# Patient Record
Sex: Female | Born: 1945
Health system: Southern US, Community
[De-identification: ages and names within clinical notes are randomized; demographics above are authoritative.]

## PROBLEM LIST (undated history)

## (undated) DIAGNOSIS — C801 Malignant (primary) neoplasm, unspecified: Secondary | ICD-10-CM

## (undated) DIAGNOSIS — M1990 Unspecified osteoarthritis, unspecified site: Secondary | ICD-10-CM

## (undated) DIAGNOSIS — R51 Headache: Secondary | ICD-10-CM

## (undated) DIAGNOSIS — R011 Cardiac murmur, unspecified: Secondary | ICD-10-CM

## (undated) DIAGNOSIS — C50919 Malignant neoplasm of unspecified site of unspecified female breast: Secondary | ICD-10-CM

## (undated) DIAGNOSIS — K219 Gastro-esophageal reflux disease without esophagitis: Secondary | ICD-10-CM

---

## 1999-12-28 ENCOUNTER — Encounter: Payer: Self-pay | Admitting: Obstetrics and Gynecology

## 1999-12-28 ENCOUNTER — Encounter: Admission: RE | Admit: 1999-12-28 | Discharge: 1999-12-28 | Payer: Self-pay | Admitting: Obstetrics and Gynecology

## 2000-01-10 ENCOUNTER — Encounter: Admission: RE | Admit: 2000-01-10 | Discharge: 2000-01-10 | Payer: Self-pay | Admitting: Obstetrics and Gynecology

## 2000-01-10 ENCOUNTER — Encounter: Payer: Self-pay | Admitting: Obstetrics and Gynecology

## 2000-12-31 ENCOUNTER — Encounter: Payer: Self-pay | Admitting: Obstetrics and Gynecology

## 2000-12-31 ENCOUNTER — Encounter: Admission: RE | Admit: 2000-12-31 | Discharge: 2000-12-31 | Payer: Self-pay | Admitting: Obstetrics and Gynecology

## 2001-03-27 ENCOUNTER — Ambulatory Visit (HOSPITAL_COMMUNITY): Admission: RE | Admit: 2001-03-27 | Discharge: 2001-03-27 | Payer: Self-pay | Admitting: Gastroenterology

## 2001-12-30 ENCOUNTER — Other Ambulatory Visit: Admission: RE | Admit: 2001-12-30 | Discharge: 2001-12-30 | Payer: Self-pay | Admitting: Gynecology

## 2002-01-14 ENCOUNTER — Encounter: Payer: Self-pay | Admitting: Gynecology

## 2002-01-14 ENCOUNTER — Encounter: Admission: RE | Admit: 2002-01-14 | Discharge: 2002-01-14 | Payer: Self-pay | Admitting: Gynecology

## 2003-01-28 ENCOUNTER — Encounter: Admission: RE | Admit: 2003-01-28 | Discharge: 2003-01-28 | Payer: Self-pay | Admitting: Gynecology

## 2003-01-28 ENCOUNTER — Encounter: Payer: Self-pay | Admitting: Gynecology

## 2003-02-04 ENCOUNTER — Other Ambulatory Visit: Admission: RE | Admit: 2003-02-04 | Discharge: 2003-02-04 | Payer: Self-pay | Admitting: Gynecology

## 2004-02-07 ENCOUNTER — Encounter: Admission: RE | Admit: 2004-02-07 | Discharge: 2004-02-07 | Payer: Self-pay | Admitting: Gynecology

## 2004-02-14 ENCOUNTER — Other Ambulatory Visit: Admission: RE | Admit: 2004-02-14 | Discharge: 2004-02-14 | Payer: Self-pay | Admitting: Gynecology

## 2004-07-17 ENCOUNTER — Ambulatory Visit (HOSPITAL_COMMUNITY): Admission: RE | Admit: 2004-07-17 | Discharge: 2004-07-17 | Payer: Self-pay | Admitting: Obstetrics and Gynecology

## 2004-10-15 DIAGNOSIS — C801 Malignant (primary) neoplasm, unspecified: Secondary | ICD-10-CM

## 2004-10-15 HISTORY — DX: Malignant (primary) neoplasm, unspecified: C80.1

## 2004-10-15 HISTORY — PX: BREAST BIOPSY: SHX20

## 2004-10-15 HISTORY — PX: BREAST LUMPECTOMY: SHX2

## 2005-02-20 ENCOUNTER — Encounter: Admission: RE | Admit: 2005-02-20 | Discharge: 2005-02-20 | Payer: Self-pay | Admitting: Obstetrics and Gynecology

## 2005-02-22 ENCOUNTER — Encounter: Admission: RE | Admit: 2005-02-22 | Discharge: 2005-02-22 | Payer: Self-pay | Admitting: Obstetrics and Gynecology

## 2005-02-23 ENCOUNTER — Encounter (INDEPENDENT_AMBULATORY_CARE_PROVIDER_SITE_OTHER): Payer: Self-pay | Admitting: Radiology

## 2005-02-23 ENCOUNTER — Encounter (INDEPENDENT_AMBULATORY_CARE_PROVIDER_SITE_OTHER): Payer: Self-pay | Admitting: Specialist

## 2005-02-23 ENCOUNTER — Encounter: Admission: RE | Admit: 2005-02-23 | Discharge: 2005-02-23 | Payer: Self-pay | Admitting: Obstetrics and Gynecology

## 2005-03-05 ENCOUNTER — Encounter: Admission: RE | Admit: 2005-03-05 | Discharge: 2005-03-05 | Payer: Self-pay | Admitting: General Surgery

## 2005-03-06 ENCOUNTER — Other Ambulatory Visit: Admission: RE | Admit: 2005-03-06 | Discharge: 2005-03-06 | Payer: Self-pay | Admitting: Obstetrics and Gynecology

## 2005-03-13 ENCOUNTER — Encounter: Admission: RE | Admit: 2005-03-13 | Discharge: 2005-03-13 | Payer: Self-pay | Admitting: General Surgery

## 2005-03-15 ENCOUNTER — Encounter (INDEPENDENT_AMBULATORY_CARE_PROVIDER_SITE_OTHER): Payer: Self-pay | Admitting: Specialist

## 2005-03-15 ENCOUNTER — Ambulatory Visit (HOSPITAL_BASED_OUTPATIENT_CLINIC_OR_DEPARTMENT_OTHER): Admission: RE | Admit: 2005-03-15 | Discharge: 2005-03-15 | Payer: Self-pay | Admitting: General Surgery

## 2005-03-15 ENCOUNTER — Ambulatory Visit (HOSPITAL_COMMUNITY): Admission: RE | Admit: 2005-03-15 | Discharge: 2005-03-15 | Payer: Self-pay | Admitting: General Surgery

## 2005-03-15 ENCOUNTER — Encounter: Admission: RE | Admit: 2005-03-15 | Discharge: 2005-03-15 | Payer: Self-pay | Admitting: General Surgery

## 2005-03-16 ENCOUNTER — Ambulatory Visit: Payer: Self-pay | Admitting: Oncology

## 2005-03-27 ENCOUNTER — Ambulatory Visit: Admission: RE | Admit: 2005-03-27 | Discharge: 2005-06-07 | Payer: Self-pay | Admitting: Radiation Oncology

## 2005-06-01 ENCOUNTER — Ambulatory Visit: Payer: Self-pay | Admitting: Oncology

## 2005-06-08 ENCOUNTER — Ambulatory Visit (HOSPITAL_COMMUNITY): Admission: RE | Admit: 2005-06-08 | Discharge: 2005-06-08 | Payer: Self-pay | Admitting: Oncology

## 2005-08-31 ENCOUNTER — Ambulatory Visit: Payer: Self-pay | Admitting: Oncology

## 2005-12-07 ENCOUNTER — Ambulatory Visit: Payer: Self-pay | Admitting: Oncology

## 2005-12-10 ENCOUNTER — Ambulatory Visit (HOSPITAL_COMMUNITY): Admission: RE | Admit: 2005-12-10 | Discharge: 2005-12-10 | Payer: Self-pay | Admitting: Oncology

## 2006-02-25 ENCOUNTER — Encounter: Admission: RE | Admit: 2006-02-25 | Discharge: 2006-02-25 | Payer: Self-pay | Admitting: Internal Medicine

## 2006-03-13 ENCOUNTER — Other Ambulatory Visit: Admission: RE | Admit: 2006-03-13 | Discharge: 2006-03-13 | Payer: Self-pay | Admitting: Obstetrics and Gynecology

## 2006-05-30 ENCOUNTER — Ambulatory Visit: Payer: Self-pay | Admitting: Oncology

## 2006-06-05 LAB — COMPREHENSIVE METABOLIC PANEL
ALT: 14 U/L (ref 0–40)
CO2: 30 mEq/L (ref 19–32)
Calcium: 9.4 mg/dL (ref 8.4–10.5)
Chloride: 105 mEq/L (ref 96–112)
Creatinine, Ser: 0.9 mg/dL (ref 0.40–1.20)
Glucose, Bld: 78 mg/dL (ref 70–99)
Sodium: 144 mEq/L (ref 135–145)
Total Bilirubin: 0.5 mg/dL (ref 0.3–1.2)
Total Protein: 6.7 g/dL (ref 6.0–8.3)

## 2006-06-05 LAB — CBC WITH DIFFERENTIAL/PLATELET
BASO%: 0.5 % (ref 0.0–2.0)
Eosinophils Absolute: 0.2 10*3/uL (ref 0.0–0.5)
HCT: 37.8 % (ref 34.8–46.6)
HGB: 13.2 g/dL (ref 11.6–15.9)
LYMPH%: 20.5 % (ref 14.0–48.0)
MCHC: 34.9 g/dL (ref 32.0–36.0)
MONO#: 0.4 10*3/uL (ref 0.1–0.9)
NEUT#: 3.3 10*3/uL (ref 1.5–6.5)
NEUT%: 66.9 % (ref 39.6–76.8)
Platelets: 221 10*3/uL (ref 145–400)
WBC: 4.9 10*3/uL (ref 3.9–10.0)
lymph#: 1 10*3/uL (ref 0.9–3.3)

## 2006-06-05 LAB — CANCER ANTIGEN 27.29: CA 27.29: 41 U/mL — ABNORMAL HIGH (ref 0–39)

## 2006-11-25 ENCOUNTER — Ambulatory Visit: Payer: Self-pay | Admitting: Oncology

## 2006-11-26 LAB — COMPREHENSIVE METABOLIC PANEL
BUN: 25 mg/dL — ABNORMAL HIGH (ref 6–23)
CO2: 28 mEq/L (ref 19–32)
Calcium: 9.1 mg/dL (ref 8.4–10.5)
Chloride: 101 mEq/L (ref 96–112)
Creatinine, Ser: 0.95 mg/dL (ref 0.40–1.20)
Glucose, Bld: 87 mg/dL (ref 70–99)
Total Bilirubin: 0.3 mg/dL (ref 0.3–1.2)

## 2006-11-26 LAB — CBC WITH DIFFERENTIAL/PLATELET
BASO%: 0.3 % (ref 0.0–2.0)
EOS%: 3.4 % (ref 0.0–7.0)
MCH: 33.1 pg (ref 26.0–34.0)
MCHC: 36.5 g/dL — ABNORMAL HIGH (ref 32.0–36.0)
MONO#: 0.4 10*3/uL (ref 0.1–0.9)
RDW: 12.3 % (ref 11.3–14.5)
WBC: 5.8 10*3/uL (ref 3.9–10.0)
lymph#: 1.2 10*3/uL (ref 0.9–3.3)

## 2007-02-27 ENCOUNTER — Encounter: Admission: RE | Admit: 2007-02-27 | Discharge: 2007-02-27 | Payer: Self-pay | Admitting: Oncology

## 2007-03-17 ENCOUNTER — Other Ambulatory Visit: Admission: RE | Admit: 2007-03-17 | Discharge: 2007-03-17 | Payer: Self-pay | Admitting: Obstetrics and Gynecology

## 2007-05-26 ENCOUNTER — Ambulatory Visit: Payer: Self-pay | Admitting: Oncology

## 2007-06-02 LAB — CANCER ANTIGEN 27.29: CA 27.29: 36 U/mL (ref 0–39)

## 2007-06-02 LAB — COMPREHENSIVE METABOLIC PANEL
ALT: 11 U/L (ref 0–35)
CO2: 27 mEq/L (ref 19–32)
Calcium: 9.1 mg/dL (ref 8.4–10.5)
Chloride: 104 mEq/L (ref 96–112)
Creatinine, Ser: 0.83 mg/dL (ref 0.40–1.20)
Glucose, Bld: 90 mg/dL (ref 70–99)
Sodium: 141 mEq/L (ref 135–145)
Total Bilirubin: 0.4 mg/dL (ref 0.3–1.2)
Total Protein: 6.5 g/dL (ref 6.0–8.3)

## 2007-06-02 LAB — CBC WITH DIFFERENTIAL/PLATELET
BASO%: 0.4 % (ref 0.0–2.0)
Eosinophils Absolute: 0.2 10*3/uL (ref 0.0–0.5)
HCT: 40.5 % (ref 34.8–46.6)
LYMPH%: 23.5 % (ref 14.0–48.0)
MCHC: 35.3 g/dL (ref 32.0–36.0)
MONO#: 0.3 10*3/uL (ref 0.1–0.9)
NEUT#: 2.7 10*3/uL (ref 1.5–6.5)
NEUT%: 63.6 % (ref 39.6–76.8)
Platelets: 183 10*3/uL (ref 145–400)
WBC: 4.3 10*3/uL (ref 3.9–10.0)
lymph#: 1 10*3/uL (ref 0.9–3.3)

## 2007-06-09 LAB — VITAMIN D PNL(25-HYDRXY+1,25-DIHY)-BLD: Vit D, 1,25-Dihydroxy: 35 pg/mL (ref 6–62)

## 2008-03-02 ENCOUNTER — Encounter: Admission: RE | Admit: 2008-03-02 | Discharge: 2008-03-02 | Payer: Self-pay | Admitting: Internal Medicine

## 2008-04-13 ENCOUNTER — Other Ambulatory Visit: Admission: RE | Admit: 2008-04-13 | Discharge: 2008-04-13 | Payer: Self-pay | Admitting: Obstetrics and Gynecology

## 2008-04-15 ENCOUNTER — Ambulatory Visit (HOSPITAL_COMMUNITY): Admission: RE | Admit: 2008-04-15 | Discharge: 2008-04-15 | Payer: Self-pay | Admitting: Obstetrics and Gynecology

## 2008-05-25 ENCOUNTER — Ambulatory Visit: Payer: Self-pay | Admitting: Oncology

## 2008-05-27 LAB — CBC WITH DIFFERENTIAL/PLATELET
EOS%: 0.1 % (ref 0.0–7.0)
Eosinophils Absolute: 0 10*3/uL (ref 0.0–0.5)
LYMPH%: 11.7 % — ABNORMAL LOW (ref 14.0–48.0)
MCH: 34.1 pg — ABNORMAL HIGH (ref 26.0–34.0)
MCHC: 35.1 g/dL (ref 32.0–36.0)
MCV: 97.4 fL (ref 81.0–101.0)
MONO%: 1.1 % (ref 0.0–13.0)
NEUT#: 4.1 10*3/uL (ref 1.5–6.5)
Platelets: 173 10*3/uL (ref 145–400)
RBC: 4.13 10*6/uL (ref 3.70–5.32)
RDW: 13 % (ref 11.3–14.5)

## 2008-05-28 LAB — VITAMIN D 25 HYDROXY (VIT D DEFICIENCY, FRACTURES): Vit D, 25-Hydroxy: 36 ng/mL (ref 30–89)

## 2008-05-28 LAB — COMPREHENSIVE METABOLIC PANEL
AST: 26 U/L (ref 0–37)
Albumin: 4.6 g/dL (ref 3.5–5.2)
Alkaline Phosphatase: 73 U/L (ref 39–117)
Glucose, Bld: 137 mg/dL — ABNORMAL HIGH (ref 70–99)
Potassium: 4.1 mEq/L (ref 3.5–5.3)
Sodium: 140 mEq/L (ref 135–145)
Total Bilirubin: 0.4 mg/dL (ref 0.3–1.2)
Total Protein: 7.5 g/dL (ref 6.0–8.3)

## 2008-05-28 LAB — FOLLICLE STIMULATING HORMONE: FSH: 69.4 m[IU]/mL

## 2008-06-05 LAB — ESTRADIOL, ULTRA SENS

## 2008-07-12 ENCOUNTER — Ambulatory Visit: Payer: Self-pay | Admitting: Pulmonary Disease

## 2008-07-12 DIAGNOSIS — G4700 Insomnia, unspecified: Secondary | ICD-10-CM | POA: Insufficient documentation

## 2008-07-12 DIAGNOSIS — R519 Headache, unspecified: Secondary | ICD-10-CM | POA: Insufficient documentation

## 2008-07-12 DIAGNOSIS — R51 Headache: Secondary | ICD-10-CM

## 2008-07-12 DIAGNOSIS — Z853 Personal history of malignant neoplasm of breast: Secondary | ICD-10-CM

## 2008-07-20 ENCOUNTER — Telehealth (INDEPENDENT_AMBULATORY_CARE_PROVIDER_SITE_OTHER): Payer: Self-pay | Admitting: *Deleted

## 2008-08-02 ENCOUNTER — Ambulatory Visit: Payer: Self-pay | Admitting: Pulmonary Disease

## 2009-03-21 ENCOUNTER — Encounter: Admission: RE | Admit: 2009-03-21 | Discharge: 2009-03-21 | Payer: Self-pay | Admitting: Oncology

## 2009-05-25 ENCOUNTER — Other Ambulatory Visit: Admission: RE | Admit: 2009-05-25 | Discharge: 2009-05-25 | Payer: Self-pay | Admitting: Obstetrics and Gynecology

## 2009-05-26 ENCOUNTER — Ambulatory Visit: Payer: Self-pay | Admitting: Oncology

## 2009-05-30 LAB — CBC WITH DIFFERENTIAL/PLATELET
Basophils Absolute: 0 10*3/uL (ref 0.0–0.1)
EOS%: 3.7 % (ref 0.0–7.0)
Eosinophils Absolute: 0.2 10*3/uL (ref 0.0–0.5)
HCT: 39.9 % (ref 34.8–46.6)
HGB: 13.8 g/dL (ref 11.6–15.9)
LYMPH%: 25.1 % (ref 14.0–49.7)
MCH: 33.1 pg (ref 25.1–34.0)
MCV: 95.7 fL (ref 79.5–101.0)
MONO%: 8 % (ref 0.0–14.0)
NEUT#: 3.1 10*3/uL (ref 1.5–6.5)
NEUT%: 62.8 % (ref 38.4–76.8)
Platelets: 182 10*3/uL (ref 145–400)

## 2009-05-30 LAB — COMPREHENSIVE METABOLIC PANEL
AST: 25 U/L (ref 0–37)
Albumin: 4.3 g/dL (ref 3.5–5.2)
Alkaline Phosphatase: 101 U/L (ref 39–117)
BUN: 25 mg/dL — ABNORMAL HIGH (ref 6–23)
Creatinine, Ser: 0.91 mg/dL (ref 0.40–1.20)
Glucose, Bld: 99 mg/dL (ref 70–99)
Potassium: 4 mEq/L (ref 3.5–5.3)

## 2009-06-10 ENCOUNTER — Encounter: Admission: RE | Admit: 2009-06-10 | Discharge: 2009-06-10 | Payer: Self-pay | Admitting: Oncology

## 2010-03-29 ENCOUNTER — Encounter: Admission: RE | Admit: 2010-03-29 | Discharge: 2010-03-29 | Payer: Self-pay | Admitting: Internal Medicine

## 2010-05-26 ENCOUNTER — Other Ambulatory Visit: Admission: RE | Admit: 2010-05-26 | Discharge: 2010-05-26 | Payer: Self-pay | Admitting: Obstetrics and Gynecology

## 2011-03-02 NOTE — Op Note (Signed)
Tonya Benson, Tonya Benson                ACCOUNT NO.:  [id number]   MEDICAL RECORD NO.:  [id number]          PATIENT TYPE:  AMB   LOCATION:  DSC                          FACILITY:  [facility]   PHYSICIAN:  Rose Phi. Maple Hudson, M.D.   DATE OF BIRTH:  Mar 11, 1946   DATE OF PROCEDURE:  03/15/2005  DATE OF DISCHARGE:                                 OPERATIVE REPORT   PREOPERATIVE DIAGNOSIS:  Stage I carcinoma of the right breast.   POSTOPERATIVE DIAGNOSIS:  Stage I carcinoma of the right breast.   OPERATION:  1.  Blue dye injection.  2.  Right partial mastectomy with needle localization and specimen      mammogram.  3.  Right sentinel lymph node biopsy.   SURGEON:  Rose Phi. Maple Hudson, M.D.   ANESTHESIA:  General.   OPERATIVE PROCEDURE:  Prior to going to the operating room a localizing wire  had been placed in the known lesion of the 12 o'clock position of the right  breast and 1 mCi of technetium sulfur colloid was injected intradermally.   After suitable general anesthesia was induced, the patient was placed in  supine position with the arms extended on an arm board. 5 cc of a mixture of  2 cc of methylene blue and 3 cc of injectable saline were injected in the  subareolar breast tissue and the breast gently massaged for about three  minutes.   After prepping and draping, a curved incision centered at the 12 o'clock  position using the previously placed wire in the right breast was then made  and the wire exposed in the incision, then a wide excision of the wire and  surrounding tissue was carried out. Specimen was oriented for the  pathologist and submitted then for specimen mammogram and then to the  pathologist for margin evaluation.   While that was being done, a short transverse right axillary incision was  made with dissection through subcutaneous tissue to the clavipectoral  fascia. Deep to the fascia were blue lymphatics which traced into a large  blue and warm lymph node. That was  excised as a sentinel lymph node and  there were no other palpable blue or hot nodes.   The pathologic interpretation of the lymph node turned out to be a lot of  fatty infiltration but no tumor cells. The specimen mammogram confirmed the  removal of the lesion and the margins turned out to be clean as reported by  the pathologist.   Both incisions had good hemostasis. Both of them were infiltrated with 0.25%  Marcaine. We then closed layers with 3-0 Vicryl and subcuticular 4-0  Monocryl and Steri-Strips.  Dressings applied. The patient transferred to  the recovery room in satisfactory condition having tolerated procedure well.      PRY/MEDQ  D:  03/15/2005  T:  03/15/2005  Job:  [id number]

## 2011-03-02 NOTE — Op Note (Signed)
[place]  Patient:    Tonya Benson, Tonya Benson                         MRN: [id number] Proc. Date: 03/27/01 Attending:  Verlin Grills, M.D. CC:         Katherine Roan, M.D.  Pearla Dubonnet, M.D.   Operative Report  PROCEDURE:  Diagnostic colonoscopy.  REFERRING PHYSICIAN:  Katherine Roan, M.D. and Pearla Dubonnet, M.D.  INDICATION:  [name] (date of birth 05-Jun-1946) is a 65 year old female.  In February 2002, she underwent her health maintenance physical exam and gynecologic exam.  She submitted stools for hemoccult testing.  Two of six stools were hemoccult positive.  ENDOSCOPIST:  Verlin Grills, M.D.  PREMEDICATION:  Versed 10 mg, fentanyl 50 mcg.  ENDOSCOPE:  Olympus pediatric colonoscopy.  DESCRIPTION OF PROCEDURE:  After obtaining informed consent, [name] was placed in the left lateral decubitus position.  I administered intravenous fentanyl and intravenous Versed to achieve conscious sedation for the procedure.  The patients blood pressure, oxygen saturation, and cardiac rhythm were monitored throughout the procedure and documented in the medical record.  Anal inspection was normal.  Digital rectal exam was normal.  The Olympus pediatric colonoscope was introduced into the rectum and easily advanced to the cecum.  Colonic preparation for the exam today was excellent.  Rectum:  Normal.  Sigmoid Colon and Descending Colon: Normal.  Splenic Flexure: Normal.  Transverse Colon: Normal.  Hepatic Flexure: Normal.  Ascending Colon: Normal.  Cecum and Ileocecal Valve: Normal.  ASSESSMENT:  Normal proctocolonoscopy to the cecum.  No endoscopic evidence for the presence of colorectal neoplasia.  RECOMMENDATIONS:  Repeat colonoscopy in approximately 10 years. DD:  03/27/01 TD:  03/27/01 Job: [id number]

## 2011-03-19 ENCOUNTER — Other Ambulatory Visit: Payer: Self-pay | Admitting: Obstetrics and Gynecology

## 2011-03-19 DIAGNOSIS — Z853 Personal history of malignant neoplasm of breast: Secondary | ICD-10-CM

## 2011-04-16 ENCOUNTER — Ambulatory Visit
Admission: RE | Admit: 2011-04-16 | Discharge: 2011-04-16 | Disposition: A | Discharge status: Home / Self Care | Payer: PRIVATE HEALTH INSURANCE | Source: Ambulatory Visit | Attending: Obstetrics and Gynecology | Admitting: Obstetrics and Gynecology

## 2011-04-16 DIAGNOSIS — Z853 Personal history of malignant neoplasm of breast: Secondary | ICD-10-CM

## 2011-05-31 ENCOUNTER — Other Ambulatory Visit (HOSPITAL_COMMUNITY)
Admission: RE | Admit: 2011-05-31 | Discharge: 2011-05-31 | Disposition: A | Discharge status: Home / Self Care | Payer: PRIVATE HEALTH INSURANCE | Source: Ambulatory Visit | Attending: Obstetrics and Gynecology | Admitting: Obstetrics and Gynecology

## 2011-05-31 ENCOUNTER — Other Ambulatory Visit: Payer: Self-pay | Admitting: Obstetrics and Gynecology

## 2011-05-31 DIAGNOSIS — Z01419 Encounter for gynecological examination (general) (routine) without abnormal findings: Secondary | ICD-10-CM | POA: Insufficient documentation

## 2012-03-18 ENCOUNTER — Other Ambulatory Visit: Payer: Self-pay | Admitting: Obstetrics and Gynecology

## 2012-03-18 DIAGNOSIS — Z853 Personal history of malignant neoplasm of breast: Secondary | ICD-10-CM

## 2012-05-08 ENCOUNTER — Ambulatory Visit
Admission: RE | Admit: 2012-05-08 | Discharge: 2012-05-08 | Disposition: A | Discharge status: Home / Self Care | Payer: PRIVATE HEALTH INSURANCE | Source: Ambulatory Visit | Attending: Obstetrics and Gynecology | Admitting: Obstetrics and Gynecology

## 2012-05-08 DIAGNOSIS — Z853 Personal history of malignant neoplasm of breast: Secondary | ICD-10-CM

## 2012-06-03 ENCOUNTER — Other Ambulatory Visit: Payer: Self-pay | Admitting: Obstetrics and Gynecology

## 2013-05-11 ENCOUNTER — Other Ambulatory Visit: Payer: Self-pay

## 2013-05-11 DIAGNOSIS — Z1231 Encounter for screening mammogram for malignant neoplasm of breast: Secondary | ICD-10-CM

## 2013-05-11 DIAGNOSIS — Z853 Personal history of malignant neoplasm of breast: Secondary | ICD-10-CM

## 2013-05-27 ENCOUNTER — Ambulatory Visit
Admission: RE | Admit: 2013-05-27 | Discharge: 2013-05-27 | Disposition: A | Discharge status: Home / Self Care | Payer: PRIVATE HEALTH INSURANCE | Source: Ambulatory Visit

## 2013-05-27 DIAGNOSIS — Z1231 Encounter for screening mammogram for malignant neoplasm of breast: Secondary | ICD-10-CM

## 2013-05-27 DIAGNOSIS — Z853 Personal history of malignant neoplasm of breast: Secondary | ICD-10-CM

## 2013-06-04 ENCOUNTER — Other Ambulatory Visit (HOSPITAL_COMMUNITY)
Admission: RE | Admit: 2013-06-04 | Discharge: 2013-06-04 | Disposition: A | Discharge status: Home / Self Care | Payer: Medicare Other | Source: Ambulatory Visit | Attending: Obstetrics and Gynecology | Admitting: Obstetrics and Gynecology

## 2013-06-04 ENCOUNTER — Other Ambulatory Visit: Payer: Self-pay | Admitting: Obstetrics and Gynecology

## 2013-06-04 DIAGNOSIS — Z01419 Encounter for gynecological examination (general) (routine) without abnormal findings: Secondary | ICD-10-CM | POA: Insufficient documentation

## 2013-06-04 DIAGNOSIS — Z1151 Encounter for screening for human papillomavirus (HPV): Secondary | ICD-10-CM | POA: Insufficient documentation

## 2013-06-04 DIAGNOSIS — N952 Postmenopausal atrophic vaginitis: Secondary | ICD-10-CM | POA: Diagnosis not present

## 2013-06-26 DIAGNOSIS — G4700 Insomnia, unspecified: Secondary | ICD-10-CM | POA: Diagnosis not present

## 2013-06-26 DIAGNOSIS — Z79899 Other long term (current) drug therapy: Secondary | ICD-10-CM | POA: Diagnosis not present

## 2013-06-26 DIAGNOSIS — Z1331 Encounter for screening for depression: Secondary | ICD-10-CM | POA: Diagnosis not present

## 2013-06-26 DIAGNOSIS — E669 Obesity, unspecified: Secondary | ICD-10-CM | POA: Diagnosis not present

## 2013-06-26 DIAGNOSIS — K219 Gastro-esophageal reflux disease without esophagitis: Secondary | ICD-10-CM | POA: Diagnosis not present

## 2013-06-26 DIAGNOSIS — B009 Herpesviral infection, unspecified: Secondary | ICD-10-CM | POA: Diagnosis not present

## 2013-06-26 DIAGNOSIS — M169 Osteoarthritis of hip, unspecified: Secondary | ICD-10-CM | POA: Diagnosis not present

## 2013-06-26 DIAGNOSIS — Z0000 Encounter for general adult medical examination without abnormal findings: Secondary | ICD-10-CM | POA: Diagnosis not present

## 2013-06-26 DIAGNOSIS — E559 Vitamin D deficiency, unspecified: Secondary | ICD-10-CM | POA: Diagnosis not present

## 2013-07-13 DIAGNOSIS — M25539 Pain in unspecified wrist: Secondary | ICD-10-CM | POA: Diagnosis not present

## 2013-07-13 DIAGNOSIS — M79609 Pain in unspecified limb: Secondary | ICD-10-CM | POA: Diagnosis not present

## 2013-07-13 DIAGNOSIS — M25519 Pain in unspecified shoulder: Secondary | ICD-10-CM | POA: Diagnosis not present

## 2013-07-15 DIAGNOSIS — M25549 Pain in joints of unspecified hand: Secondary | ICD-10-CM | POA: Diagnosis not present

## 2013-12-09 DIAGNOSIS — M169 Osteoarthritis of hip, unspecified: Secondary | ICD-10-CM | POA: Diagnosis not present

## 2013-12-09 DIAGNOSIS — M1610 Unilateral primary osteoarthritis, unspecified hip: Secondary | ICD-10-CM | POA: Diagnosis not present

## 2013-12-24 DIAGNOSIS — M1610 Unilateral primary osteoarthritis, unspecified hip: Secondary | ICD-10-CM | POA: Diagnosis not present

## 2013-12-24 DIAGNOSIS — M169 Osteoarthritis of hip, unspecified: Secondary | ICD-10-CM | POA: Diagnosis not present

## 2013-12-25 DIAGNOSIS — Z79899 Other long term (current) drug therapy: Secondary | ICD-10-CM | POA: Diagnosis not present

## 2013-12-25 DIAGNOSIS — M1610 Unilateral primary osteoarthritis, unspecified hip: Secondary | ICD-10-CM | POA: Diagnosis not present

## 2013-12-25 DIAGNOSIS — M169 Osteoarthritis of hip, unspecified: Secondary | ICD-10-CM | POA: Diagnosis not present

## 2014-01-20 ENCOUNTER — Encounter (HOSPITAL_COMMUNITY): Payer: Self-pay | Admitting: Pharmacy Technician

## 2014-01-25 ENCOUNTER — Encounter (HOSPITAL_COMMUNITY): Payer: Self-pay

## 2014-01-25 ENCOUNTER — Encounter (HOSPITAL_COMMUNITY)
Admission: RE | Admit: 2014-01-25 | Discharge: 2014-01-25 | Disposition: A | Discharge status: Home / Self Care | Payer: Medicare Other | Source: Ambulatory Visit | Attending: Orthopedic Surgery | Admitting: Orthopedic Surgery

## 2014-01-25 ENCOUNTER — Encounter (INDEPENDENT_AMBULATORY_CARE_PROVIDER_SITE_OTHER): Payer: Self-pay

## 2014-01-25 DIAGNOSIS — M169 Osteoarthritis of hip, unspecified: Secondary | ICD-10-CM | POA: Diagnosis not present

## 2014-01-25 DIAGNOSIS — Z0183 Encounter for blood typing: Secondary | ICD-10-CM | POA: Diagnosis not present

## 2014-01-25 DIAGNOSIS — M1610 Unilateral primary osteoarthritis, unspecified hip: Secondary | ICD-10-CM | POA: Diagnosis not present

## 2014-01-25 DIAGNOSIS — Z01812 Encounter for preprocedural laboratory examination: Secondary | ICD-10-CM | POA: Insufficient documentation

## 2014-01-25 HISTORY — DX: Cardiac murmur, unspecified: R01.1

## 2014-01-25 HISTORY — DX: Headache: R51

## 2014-01-25 HISTORY — DX: Unspecified osteoarthritis, unspecified site: M19.90

## 2014-01-25 HISTORY — DX: Malignant (primary) neoplasm, unspecified: C80.1

## 2014-01-25 HISTORY — DX: Gastro-esophageal reflux disease without esophagitis: K21.9

## 2014-01-25 LAB — URINE MICROSCOPIC-ADD ON

## 2014-01-25 LAB — URINALYSIS, ROUTINE W REFLEX MICROSCOPIC
Bilirubin Urine: NEGATIVE
Glucose, UA: NEGATIVE mg/dL
Hgb urine dipstick: NEGATIVE
Ketones, ur: NEGATIVE mg/dL
Nitrite: NEGATIVE
PROTEIN: NEGATIVE mg/dL
Specific Gravity, Urine: 1.022 (ref 1.005–1.030)
UROBILINOGEN UA: 0.2 mg/dL (ref 0.0–1.0)
pH: 5 (ref 5.0–8.0)

## 2014-01-25 LAB — BASIC METABOLIC PANEL
BUN: 19 mg/dL (ref 6–23)
CALCIUM: 9.6 mg/dL (ref 8.4–10.5)
CO2: 25 mEq/L (ref 19–32)
CREATININE: 0.82 mg/dL (ref 0.50–1.10)
Chloride: 101 mEq/L (ref 96–112)
GFR, EST AFRICAN AMERICAN: 85 mL/min — AB (ref >90)
GFR, EST NON AFRICAN AMERICAN: 73 mL/min — AB (ref >90)
Glucose, Bld: 87 mg/dL (ref 70–99)
Potassium: 4.1 mEq/L (ref 3.7–5.3)
SODIUM: 139 meq/L (ref 137–147)

## 2014-01-25 LAB — CBC
HCT: 39.6 % (ref 36.0–46.0)
Hemoglobin: 13.5 g/dL (ref 12.0–15.0)
MCH: 31.9 pg (ref 26.0–34.0)
MCHC: 34.1 g/dL (ref 30.0–36.0)
MCV: 93.6 fL (ref 78.0–100.0)
PLATELETS: 201 10*3/uL (ref 150–400)
RBC: 4.23 MIL/uL (ref 3.87–5.11)
RDW: 12.4 % (ref 11.5–15.5)
WBC: 6.2 10*3/uL (ref 4.0–10.5)

## 2014-01-25 LAB — SURGICAL PCR SCREEN
MRSA, PCR: NEGATIVE
STAPHYLOCOCCUS AUREUS: POSITIVE — AB

## 2014-01-25 LAB — APTT: aPTT: 31 seconds (ref 24–37)

## 2014-01-25 LAB — PROTIME-INR
INR: 1 (ref 0.00–1.49)
PROTHROMBIN TIME: 13 s (ref 11.6–15.2)

## 2014-01-25 LAB — ABO/RH: ABO/RH(D): O NEG

## 2014-01-25 NOTE — Progress Notes (Signed)
Surgery clearance note Dr. Inda Merlin on chart

## 2014-01-25 NOTE — Patient Instructions (Addendum)
[place]  01/25/2014   Your procedure is scheduled on: 02/02/14  Report to [facility] at 05:15 AM.  Call this number if you have problems the morning of surgery 336-: [phone]   Remember: Please do not take any vitamins or herbal medications before surgery   Do not eat food or drink liquids After Midnight.     Take these medicines the morning of surgery with A SIP OF WATER: nexium   Do not wear jewelry, make-up or nail polish.  Do not wear lotions, powders, or perfumes. You may wear deodorant.  Do not shave 48 hours prior to surgery. Men may shave face and neck.  Do not bring valuables to the hospital.  Contacts, dentures or bridgework may not be worn into surgery.  Leave suitcase in the car. After surgery it may be brought to your room.  For patients admitted to the hospital, checkout time is 11:00 AM the day of discharge.   Paulette Blanch, RN  pre op nurse call if needed [phone]    [facility] - Preparing for Surgery Before surgery, you can play an important role.  Because skin is not sterile, your skin needs to be as free of germs as possible.  You can reduce the number of germs on your skin by washing with CHG (chlorahexidine gluconate) soap before surgery.  CHG is an antiseptic cleaner which kills germs and bonds with the skin to continue killing germs even after washing. Please DO NOT use if you have an allergy to CHG or antibacterial soaps.  If your skin becomes reddened/irritated stop using the CHG and inform your nurse when you arrive at Short Stay. Do not shave (including legs and underarms) for at least 48 hours prior to the first CHG shower.  You may shave your face. Please follow these instructions carefully:  1.  Shower with CHG Soap the night before surgery and the  morning of Surgery.  2.  If you choose to wash your hair, wash your hair first as usual with your  normal  shampoo.  3.  After you shampoo, rinse your hair and body thoroughly to  remove the  shampoo.                           4.  Use CHG as you would any other liquid soap.  You can apply chg directly  to the skin and wash                       Gently with a scrungie or clean washcloth.  5.  Apply the CHG Soap to your body ONLY FROM THE NECK DOWN.   Do not use on open                           Wound or open sores. Avoid contact with eyes, ears mouth and genitals (private parts).                        Genitals (private parts) with your normal soap.             6.  Wash thoroughly, paying special attention to the area where your surgery  will be performed.  7.  Thoroughly rinse your body with warm water from the neck down.  8.  DO NOT shower/wash with your normal soap after  using and rinsing off  the CHG Soap.             9.  Pat yourself dry with a clean towel.            10.  Wear clean pajamas.            11.  Place clean sheets on your bed the night of your first shower and do not  sleep with pets. Day of Surgery : Do not apply any lotions/deodorants the morning of surgery.  Please wear clean clothes to the hospital/surgery center.  FAILURE TO FOLLOW THESE INSTRUCTIONS MAY RESULT IN THE CANCELLATION OF YOUR SURGERY PATIENT SIGNATURE_________________________________  NURSE SIGNATURE__________________________________    Incentive Spirometer  An incentive spirometer is a tool that can help keep your lungs clear and active. This tool measures how well you are filling your lungs with each breath. Taking long deep breaths may help reverse or decrease the chance of developing breathing (pulmonary) problems (especially infection) following:  A long period of time when you are unable to move or be active. BEFORE THE PROCEDURE   If the spirometer includes an indicator to show your best effort, your nurse or respiratory therapist will set it to a desired goal.  If possible, sit up straight or lean slightly forward. Try not to slouch.  Hold the incentive spirometer in  an upright position. INSTRUCTIONS FOR USE  1. Sit on the edge of your bed if possible, or sit up as far as you can in bed or on a chair. 2. Hold the incentive spirometer in an upright position. 3. Breathe out normally. 4. Place the mouthpiece in your mouth and seal your lips tightly around it. 5. Breathe in slowly and as deeply as possible, raising the piston or the ball toward the top of the column. 6. Hold your breath for 3-5 seconds or for as long as possible. Allow the piston or ball to fall to the bottom of the column. 7. Remove the mouthpiece from your mouth and breathe out normally. 8. Rest for a few seconds and repeat Steps 1 through 7 at least 10 times every 1-2 hours when you are awake. Take your time and take a few normal breaths between deep breaths. 9. The spirometer may include an indicator to show your best effort. Use the indicator as a goal to work toward during each repetition. 10. After each set of 10 deep breaths, practice coughing to be sure your lungs are clear. If you have an incision (the cut made at the time of surgery), support your incision when coughing by placing a pillow or rolled up towels firmly against it. Once you are able to get out of bed, walk around indoors and cough well. You may stop using the incentive spirometer when instructed by your caregiver.  RISKS AND COMPLICATIONS  Take your time so you do not get dizzy or light-headed.  If you are in pain, you may need to take or ask for pain medication before doing incentive spirometry. It is harder to take a deep breath if you are having pain. AFTER USE  Rest and breathe slowly and easily.  It can be helpful to keep track of a log of your progress. Your caregiver can provide you with a simple table to help with this. If you are using the spirometer at home, follow these instructions: [place] IF:   You are having difficultly using the spirometer.  You have trouble using the spirometer as  often  as instructed.  Your pain medication is not giving enough relief while using the spirometer.  You develop fever of 100.5 F (38.1 C) or higher. SEEK IMMEDIATE MEDICAL CARE IF:   You cough up bloody sputum that had not been present before.  You develop fever of 102 F (38.9 C) or greater.  You develop worsening pain at or near the incision site. MAKE SURE YOU:   Understand these instructions.  Will watch your condition.  Will get help right away if you are not doing well or get worse. Document Released: 02/11/2007 Document Revised: 12/24/2011 Document Reviewed: 04/14/2007 [facility] Patient Information 2014 [place], [place].   WHAT IS A BLOOD TRANSFUSION? Blood Transfusion Information  A transfusion is the replacement of blood or some of its parts. Blood is made up of multiple cells which provide different functions.  Red blood cells carry oxygen and are used for blood loss replacement.  White blood cells fight against infection.  Platelets control bleeding.  Plasma helps clot blood.  Other blood products are available for specialized needs, such as hemophilia or other clotting disorders. BEFORE THE TRANSFUSION  Who gives blood for transfusions?   Healthy volunteers who are fully evaluated to make sure their blood is safe. This is blood bank blood. Transfusion therapy is the safest it has ever been in the practice of medicine. Before blood is taken from a donor, a complete history is taken to make sure that person has no history of diseases nor engages in risky social behavior (examples are intravenous drug use or sexual activity with multiple partners). The donor's travel history is screened to minimize risk of transmitting infections, such as malaria. The donated blood is tested for signs of infectious diseases, such as HIV and hepatitis. The blood is then tested to be sure it is compatible with you in order to minimize the chance of a transfusion reaction. If you or a  relative donates blood, this is often done in anticipation of surgery and is not appropriate for emergency situations. It takes many days to process the donated blood. RISKS AND COMPLICATIONS Although transfusion therapy is very safe and saves many lives, the main dangers of transfusion include:   Getting an infectious disease.  Developing a transfusion reaction. This is an allergic reaction to something in the blood you were given. Every precaution is taken to prevent this. The decision to have a blood transfusion has been considered carefully by your caregiver before blood is given. Blood is not given unless the benefits outweigh the risks. AFTER THE TRANSFUSION  Right after receiving a blood transfusion, you will usually feel much better and more energetic. This is especially true if your red blood cells have gotten low (anemic). The transfusion raises the level of the red blood cells which carry oxygen, and this usually causes an energy increase.  The nurse administering the transfusion will monitor you carefully for complications. HOME CARE INSTRUCTIONS  No special instructions are needed after a transfusion. You may find your energy is better. Speak with your caregiver about any limitations on activity for underlying diseases you may have. SEEK MEDICAL CARE IF:   Your condition is not improving after your transfusion.  You develop redness or irritation at the intravenous (IV) site. SEEK IMMEDIATE MEDICAL CARE IF:  Any of the following symptoms occur over the next 12 hours:  Shaking chills.  You have a temperature by mouth above 102 F (38.9 C), not controlled by medicine.  Chest, back, or muscle pain.  People around you feel you are not acting correctly or are confused.  Shortness of breath or difficulty breathing.  Dizziness and fainting.  You get a rash or develop hives.  You have a decrease in urine output.  Your urine turns a dark color or changes to pink, red, or  brown. Any of the following symptoms occur over the next 10 days:  You have a temperature by mouth above 102 F (38.9 C), not controlled by medicine.  Shortness of breath.  Weakness after normal activity.  The white part of the eye turns yellow (jaundice).  You have a decrease in the amount of urine or are urinating less often.  Your urine turns a dark color or changes to pink, red, or brown. Document Released: 09/28/2000 Document Revised: 12/24/2011 Document Reviewed: 05/17/2008 [facility] Patient Information 2014 [place].

## 2014-01-26 NOTE — Progress Notes (Signed)
Surgical screen and micro ua results faxed to dr Alvan Dame by epic

## 2014-01-28 NOTE — H&P (Signed)
TOTAL HIP ADMISSION H&P  Patient is admitted for right total hip arthroplasty, anterior approach.  Subjective:  Chief Complaint: Right hip OA / pain  HPI: Tonya Benson, 68 y.o. female, has a history of pain and functional disability in the right hip(s) due to arthritis and patient has failed non-surgical conservative treatments for greater than 12 weeks to include NSAID's and/or analgesics and activity modification.  Onset of symptoms was gradual starting 2+ years ago with gradually worsening course since that time.The patient noted no past surgery on the right hip(s).  Patient currently rates pain in the right hip at 10 out of 10 with activity. Patient has night pain, worsening of pain with activity and weight bearing, trendelenberg gait, pain that interfers with activities of daily living and pain with passive range of motion. Patient has evidence of periarticular osteophytes and joint space narrowing by imaging studies. This condition presents safety issues increasing the risk of falls.  There is no current active infection.  Risks, benefits and expectations were discussed with the patient.  Risks including but not limited to the risk of anesthesia, blood clots, nerve damage, blood vessel damage, failure of the prosthesis, infection and up to and including death.  Patient understand the risks, benefits and expectations and wishes to proceed with surgery.   D/C Plans:   Home with HHPT  Post-op Meds:    No Rx given  Tranexamic Acid:   To be given  Decadron:    To be given  FYI:    ASA post-op  Norco post-op   Patient Active Problem List   Diagnosis Date Noted  . PERSISTENT DISORDER INITIATING/MAINTAINING SLEEP 07/12/2008  . HEADACHE, CHRONIC 07/12/2008  . ADENOCARCINOMA, BREAST, HX OF 07/12/2008   Past Medical History  Diagnosis Date  . Heart murmur     slight  . GERD (gastroesophageal reflux disease)     occasional  . Headache(784.0)     in the past, none recent  . Arthritis    . Cancer 2006    right breast- over 30 radiation treatments    Past Surgical History  Procedure Laterality Date  . Breast lumpectomy Right 2006  . Breast biopsy Right 2006    No prescriptions prior to admission   Allergies  Allergen Reactions  . Adhesive [Tape] Rash    History  Substance Use Topics  . Smoking status: Never Smoker   . Smokeless tobacco: Never Used  . Alcohol Use: Yes     Comment: 2 glasses a week    No family history on file.   Review of Systems  Constitutional: Negative.   Eyes: Negative.   Respiratory: Negative.   Cardiovascular: Negative.   Gastrointestinal: Positive for heartburn.  Genitourinary: Negative.   Musculoskeletal: Positive for joint pain.  Skin: Negative.   Neurological: Positive for headaches.  Endo/Heme/Allergies: Negative.   Psychiatric/Behavioral: The patient has insomnia.     Objective:  Physical Exam  Constitutional: She appears well-developed and well-nourished.  HENT:  Head: Normocephalic and atraumatic.  Mouth/Throat: Oropharynx is clear and moist.  Eyes: Pupils are equal, round, and reactive to light.  Neck: Neck supple. No JVD present. No tracheal deviation present. No thyromegaly present.  Cardiovascular: Normal rate, regular rhythm and intact distal pulses.   Murmur heard. Respiratory: Effort normal and breath sounds normal. No stridor. No respiratory distress. She has no wheezes.  GI: Soft. There is no tenderness. There is no guarding.  Musculoskeletal:       Right hip: She exhibits decreased  range of motion, decreased strength, tenderness and bony tenderness. She exhibits no swelling, no deformity and no laceration.  Lymphadenopathy:    She has no cervical adenopathy.  Neurological: She is alert.  Skin: Skin is warm and dry.  Psychiatric: She has a normal mood and affect.     Labs:  Estimated body mass index is 28.22 kg/(m^2) as calculated from the following:   Height as of 07/12/08: 5' 7.5" (1.715 m).    Weight as of 08/02/08: 83.008 kg (183 lb).   Imaging Review Plain radiographs demonstrate severe degenerative joint disease of the right hip(s). The bone quality appears to be good for age and reported activity level.  Assessment/Plan:  End stage arthritis, right hip(s)  The patient history, physical examination, clinical judgement of the provider and imaging studies are consistent with end stage degenerative joint disease of the right hip(s) and total hip arthroplasty is deemed medically necessary. The treatment options including medical management, injection therapy, arthroscopy and arthroplasty were discussed at length. The risks and benefits of total hip arthroplasty were presented and reviewed. The risks due to aseptic loosening, infection, stiffness, dislocation/subluxation,  thromboembolic complications and other imponderables were discussed.  The patient acknowledged the explanation, agreed to proceed with the plan and consent was signed. Patient is being admitted for inpatient treatment for surgery, pain control, PT, OT, prophylactic antibiotics, VTE prophylaxis, progressive ambulation and ADL's and discharge planning.The patient is planning to be discharged home with home health services.      Tonya Benson Tonya Benson   PAC  01/28/2014, 8:41 PM

## 2014-02-02 ENCOUNTER — Inpatient Hospital Stay (HOSPITAL_COMMUNITY): Payer: Medicare Other

## 2014-02-02 ENCOUNTER — Encounter (HOSPITAL_COMMUNITY): Payer: Self-pay | Admitting: *Deleted

## 2014-02-02 ENCOUNTER — Encounter (HOSPITAL_COMMUNITY): Payer: Medicare Other | Admitting: Anesthesiology

## 2014-02-02 ENCOUNTER — Inpatient Hospital Stay (HOSPITAL_COMMUNITY)
Admission: RE | Admit: 2014-02-02 | Discharge: 2014-02-03 | DRG: 470 | Disposition: A | Discharge status: Home Health | Payer: Medicare Other | Source: Ambulatory Visit | Attending: Orthopedic Surgery | Admitting: Orthopedic Surgery

## 2014-02-02 ENCOUNTER — Encounter (HOSPITAL_COMMUNITY)
Admission: RE | Disposition: A | Discharge status: Home Health | Payer: Self-pay | Source: Ambulatory Visit | Attending: Orthopedic Surgery

## 2014-02-02 ENCOUNTER — Inpatient Hospital Stay (HOSPITAL_COMMUNITY): Payer: Medicare Other | Admitting: Anesthesiology

## 2014-02-02 DIAGNOSIS — D62 Acute posthemorrhagic anemia: Secondary | ICD-10-CM | POA: Diagnosis not present

## 2014-02-02 DIAGNOSIS — D500 Iron deficiency anemia secondary to blood loss (chronic): Secondary | ICD-10-CM | POA: Diagnosis not present

## 2014-02-02 DIAGNOSIS — K219 Gastro-esophageal reflux disease without esophagitis: Secondary | ICD-10-CM | POA: Diagnosis present

## 2014-02-02 DIAGNOSIS — Z01812 Encounter for preprocedural laboratory examination: Secondary | ICD-10-CM | POA: Diagnosis not present

## 2014-02-02 DIAGNOSIS — R51 Headache: Secondary | ICD-10-CM | POA: Diagnosis present

## 2014-02-02 DIAGNOSIS — Z853 Personal history of malignant neoplasm of breast: Secondary | ICD-10-CM | POA: Diagnosis not present

## 2014-02-02 DIAGNOSIS — Z6829 Body mass index (BMI) 29.0-29.9, adult: Secondary | ICD-10-CM

## 2014-02-02 DIAGNOSIS — Z471 Aftercare following joint replacement surgery: Secondary | ICD-10-CM | POA: Diagnosis not present

## 2014-02-02 DIAGNOSIS — M169 Osteoarthritis of hip, unspecified: Principal | ICD-10-CM | POA: Diagnosis present

## 2014-02-02 DIAGNOSIS — Z96649 Presence of unspecified artificial hip joint: Secondary | ICD-10-CM | POA: Diagnosis not present

## 2014-02-02 DIAGNOSIS — R011 Cardiac murmur, unspecified: Secondary | ICD-10-CM | POA: Diagnosis present

## 2014-02-02 DIAGNOSIS — M1610 Unilateral primary osteoarthritis, unspecified hip: Principal | ICD-10-CM | POA: Diagnosis present

## 2014-02-02 DIAGNOSIS — M259 Joint disorder, unspecified: Secondary | ICD-10-CM | POA: Diagnosis not present

## 2014-02-02 DIAGNOSIS — E663 Overweight: Secondary | ICD-10-CM | POA: Diagnosis not present

## 2014-02-02 DIAGNOSIS — M25559 Pain in unspecified hip: Secondary | ICD-10-CM | POA: Diagnosis not present

## 2014-02-02 HISTORY — PX: TOTAL HIP ARTHROPLASTY: SHX124

## 2014-02-02 LAB — TYPE AND SCREEN
ABO/RH(D): O NEG
Antibody Screen: NEGATIVE

## 2014-02-02 SURGERY — ARTHROPLASTY, HIP, TOTAL, ANTERIOR APPROACH
Anesthesia: General | Site: Hip | Laterality: Right

## 2014-02-02 MED ORDER — SUCCINYLCHOLINE CHLORIDE 20 MG/ML IJ SOLN
INTRAMUSCULAR | Status: DC | PRN
  Administered 2014-02-02: 80 mg via INTRAVENOUS

## 2014-02-02 MED ORDER — HYDROCODONE-ACETAMINOPHEN 7.5-325 MG PO TABS
1.0000 | ORAL_TABLET | ORAL | Status: DC
  Administered 2014-02-02 (×2): 1 via ORAL
  Administered 2014-02-02: 2 via ORAL
  Administered 2014-02-03 (×2): 1 via ORAL
  Filled 2014-02-02: qty 2
  Filled 2014-02-02 (×5): qty 1

## 2014-02-02 MED ORDER — FERROUS SULFATE 325 (65 FE) MG PO TABS
325.0000 mg | ORAL_TABLET | Freq: Three times a day (TID) | ORAL | Status: DC
  Administered 2014-02-03: 325 mg via ORAL
  Filled 2014-02-02 (×6): qty 1

## 2014-02-02 MED ORDER — PHENOL 1.4 % MT LIQD: 1.0000 | OROMUCOSAL | Status: DC | PRN

## 2014-02-02 MED ORDER — TRANEXAMIC ACID 100 MG/ML IV SOLN
1000.0000 mg | Freq: Once | INTRAVENOUS | Status: AC
  Administered 2014-02-02: 1000 mg via INTRAVENOUS
  Filled 2014-02-02: qty 10

## 2014-02-02 MED ORDER — FENTANYL CITRATE 0.05 MG/ML IJ SOLN
INTRAMUSCULAR | Status: AC
  Filled 2014-02-02: qty 2

## 2014-02-02 MED ORDER — METOCLOPRAMIDE HCL 5 MG/ML IJ SOLN: 5.0000 mg | Freq: Four times a day (QID) | INTRAMUSCULAR | Status: DC | PRN

## 2014-02-02 MED ORDER — ONDANSETRON HCL 4 MG/2ML IJ SOLN
INTRAMUSCULAR | Status: AC
  Filled 2014-02-02: qty 2

## 2014-02-02 MED ORDER — CEFAZOLIN SODIUM-DEXTROSE 2-3 GM-% IV SOLR
2.0000 g | Freq: Four times a day (QID) | INTRAVENOUS | Status: AC
  Administered 2014-02-02 (×2): 2 g via INTRAVENOUS
  Filled 2014-02-02 (×2): qty 50

## 2014-02-02 MED ORDER — SODIUM CHLORIDE 0.9 % IJ SOLN
INTRAMUSCULAR | Status: AC
  Filled 2014-02-02: qty 10

## 2014-02-02 MED ORDER — LIDOCAINE HCL (CARDIAC) 20 MG/ML IV SOLN
INTRAVENOUS | Status: DC | PRN
  Administered 2014-02-02: 100 mg via INTRAVENOUS

## 2014-02-02 MED ORDER — ZOLPIDEM TARTRATE 5 MG PO TABS: 5.0000 mg | ORAL_TABLET | Freq: Every evening | ORAL | Status: DC | PRN

## 2014-02-02 MED ORDER — SUFENTANIL CITRATE 50 MCG/ML IV SOLN
INTRAVENOUS | Status: AC
  Filled 2014-02-02: qty 1

## 2014-02-02 MED ORDER — DEXAMETHASONE SODIUM PHOSPHATE 10 MG/ML IJ SOLN
INTRAMUSCULAR | Status: AC
  Filled 2014-02-02: qty 1

## 2014-02-02 MED ORDER — VALACYCLOVIR HCL 500 MG PO TABS
500.0000 mg | ORAL_TABLET | Freq: Every day | ORAL | Status: DC
  Administered 2014-02-02 – 2014-02-03 (×2): 500 mg via ORAL
  Filled 2014-02-02 (×2): qty 1

## 2014-02-02 MED ORDER — 0.9 % SODIUM CHLORIDE (POUR BTL) OPTIME
TOPICAL | Status: DC | PRN
  Administered 2014-02-02: 1000 mL

## 2014-02-02 MED ORDER — CISATRACURIUM BESYLATE 20 MG/10ML IV SOLN
INTRAVENOUS | Status: AC
  Filled 2014-02-02: qty 10

## 2014-02-02 MED ORDER — KETOROLAC TROMETHAMINE 15 MG/ML IJ SOLN
7.5000 mg | Freq: Four times a day (QID) | INTRAMUSCULAR | Status: DC | PRN
  Administered 2014-02-02: 7.5 mg via INTRAVENOUS
  Filled 2014-02-02: qty 1

## 2014-02-02 MED ORDER — PROPOFOL 10 MG/ML IV BOLUS
INTRAVENOUS | Status: AC
  Filled 2014-02-02: qty 20

## 2014-02-02 MED ORDER — METHOCARBAMOL 500 MG PO TABS
500.0000 mg | ORAL_TABLET | Freq: Four times a day (QID) | ORAL | Status: DC | PRN
  Administered 2014-02-03 (×2): 500 mg via ORAL
  Filled 2014-02-02 (×2): qty 1

## 2014-02-02 MED ORDER — SENNA 8.6 MG PO TABS
1.0000 | ORAL_TABLET | Freq: Two times a day (BID) | ORAL | Status: DC
  Administered 2014-02-02 – 2014-02-03 (×3): 8.6 mg via ORAL

## 2014-02-02 MED ORDER — DEXAMETHASONE SODIUM PHOSPHATE 10 MG/ML IJ SOLN
10.0000 mg | Freq: Once | INTRAMUSCULAR | Status: AC
  Administered 2014-02-03: 10 mg via INTRAVENOUS
  Filled 2014-02-02 (×2): qty 1

## 2014-02-02 MED ORDER — METHOCARBAMOL 100 MG/ML IJ SOLN
500.0000 mg | Freq: Four times a day (QID) | INTRAVENOUS | Status: DC | PRN
  Administered 2014-02-02: 500 mg via INTRAVENOUS
  Filled 2014-02-02: qty 5

## 2014-02-02 MED ORDER — MIDAZOLAM HCL 5 MG/5ML IJ SOLN
INTRAMUSCULAR | Status: DC | PRN
  Administered 2014-02-02 (×2): 1 mg via INTRAVENOUS

## 2014-02-02 MED ORDER — DOCUSATE SODIUM 100 MG PO CAPS
100.0000 mg | ORAL_CAPSULE | Freq: Two times a day (BID) | ORAL | Status: DC
  Administered 2014-02-02 – 2014-02-03 (×3): 100 mg via ORAL

## 2014-02-02 MED ORDER — LIDOCAINE HCL (CARDIAC) 20 MG/ML IV SOLN
INTRAVENOUS | Status: AC
  Filled 2014-02-02: qty 5

## 2014-02-02 MED ORDER — MIDAZOLAM HCL 2 MG/2ML IJ SOLN
INTRAMUSCULAR | Status: AC
  Filled 2014-02-02: qty 2

## 2014-02-02 MED ORDER — NEOSTIGMINE METHYLSULFATE 1 MG/ML IJ SOLN
INTRAMUSCULAR | Status: DC | PRN
  Administered 2014-02-02: 3 mg via INTRAVENOUS

## 2014-02-02 MED ORDER — CEFAZOLIN SODIUM-DEXTROSE 2-3 GM-% IV SOLR
2.0000 g | INTRAVENOUS | Status: AC
  Administered 2014-02-02: 2 g via INTRAVENOUS

## 2014-02-02 MED ORDER — CEFAZOLIN SODIUM-DEXTROSE 2-3 GM-% IV SOLR
INTRAVENOUS | Status: AC
  Filled 2014-02-02: qty 50

## 2014-02-02 MED ORDER — METOCLOPRAMIDE HCL 10 MG PO TABS: 5.0000 mg | ORAL_TABLET | Freq: Four times a day (QID) | ORAL | Status: DC | PRN

## 2014-02-02 MED ORDER — POLYETHYLENE GLYCOL 3350 17 G PO PACK: 17.0000 g | PACK | Freq: Every day | ORAL | Status: DC | PRN

## 2014-02-02 MED ORDER — CISATRACURIUM BESYLATE (PF) 10 MG/5ML IV SOLN
INTRAVENOUS | Status: DC | PRN
  Administered 2014-02-02: 6 mg via INTRAVENOUS

## 2014-02-02 MED ORDER — PROMETHAZINE HCL 25 MG/ML IJ SOLN: 6.2500 mg | INTRAMUSCULAR | Status: DC | PRN

## 2014-02-02 MED ORDER — LACTATED RINGERS IV SOLN
INTRAVENOUS | Status: DC | PRN
  Administered 2014-02-02 (×2): via INTRAVENOUS

## 2014-02-02 MED ORDER — EPHEDRINE SULFATE 50 MG/ML IJ SOLN
INTRAMUSCULAR | Status: AC
  Filled 2014-02-02: qty 1

## 2014-02-02 MED ORDER — PANTOPRAZOLE SODIUM 40 MG PO TBEC
40.0000 mg | DELAYED_RELEASE_TABLET | Freq: Every day | ORAL | Status: DC
  Administered 2014-02-02 – 2014-02-03 (×2): 40 mg via ORAL
  Filled 2014-02-02 (×2): qty 1

## 2014-02-02 MED ORDER — SODIUM CHLORIDE 0.9 % IV SOLN
INTRAVENOUS | Status: DC
  Administered 2014-02-02: 11:00:00 via INTRAVENOUS
  Filled 2014-02-02 (×4): qty 1000

## 2014-02-02 MED ORDER — DIPHENHYDRAMINE HCL 50 MG PO CAPS: 50.0000 mg | ORAL_CAPSULE | Freq: Every evening | ORAL | Status: DC | PRN

## 2014-02-02 MED ORDER — DEXAMETHASONE SODIUM PHOSPHATE 10 MG/ML IJ SOLN
10.0000 mg | Freq: Once | INTRAMUSCULAR | Status: AC
  Administered 2014-02-02: 10 mg via INTRAVENOUS

## 2014-02-02 MED ORDER — MENTHOL 3 MG MT LOZG
1.0000 | LOZENGE | OROMUCOSAL | Status: DC | PRN
  Filled 2014-02-02: qty 9

## 2014-02-02 MED ORDER — GLYCOPYRROLATE 0.2 MG/ML IJ SOLN
INTRAMUSCULAR | Status: DC | PRN
  Administered 2014-02-02: .4 mg via INTRAVENOUS

## 2014-02-02 MED ORDER — ASPIRIN EC 325 MG PO TBEC
325.0000 mg | DELAYED_RELEASE_TABLET | Freq: Two times a day (BID) | ORAL | Status: DC
  Administered 2014-02-02 – 2014-02-03 (×2): 325 mg via ORAL
  Filled 2014-02-02 (×4): qty 1

## 2014-02-02 MED ORDER — HYDROMORPHONE HCL PF 1 MG/ML IJ SOLN: 0.5000 mg | INTRAMUSCULAR | Status: DC | PRN

## 2014-02-02 MED ORDER — ALUM & MAG HYDROXIDE-SIMETH 200-200-20 MG/5ML PO SUSP: 30.0000 mL | ORAL | Status: DC | PRN

## 2014-02-02 MED ORDER — SODIUM CHLORIDE 0.9 % IV BOLUS (SEPSIS)
250.0000 mL | Freq: Once | INTRAVENOUS | Status: AC
  Administered 2014-02-02: 250 mL via INTRAVENOUS

## 2014-02-02 MED ORDER — PROMETHAZINE HCL 25 MG/ML IJ SOLN
12.5000 mg | Freq: Four times a day (QID) | INTRAMUSCULAR | Status: DC | PRN
  Administered 2014-02-02: 12.5 mg via INTRAVENOUS
  Filled 2014-02-02: qty 1

## 2014-02-02 MED ORDER — ACETAMINOPHEN 10 MG/ML IV SOLN
INTRAVENOUS | Status: DC | PRN
  Administered 2014-02-02: 1000 mg via INTRAVENOUS

## 2014-02-02 MED ORDER — HYDROMORPHONE HCL PF 1 MG/ML IJ SOLN
0.2500 mg | INTRAMUSCULAR | Status: DC | PRN
  Administered 2014-02-02 (×4): 0.5 mg via INTRAVENOUS

## 2014-02-02 MED ORDER — HYDROMORPHONE HCL PF 1 MG/ML IJ SOLN
INTRAMUSCULAR | Status: AC
  Filled 2014-02-02: qty 1

## 2014-02-02 MED ORDER — ONDANSETRON HCL 4 MG/2ML IJ SOLN
INTRAMUSCULAR | Status: DC | PRN
  Administered 2014-02-02: 4 mg via INTRAVENOUS

## 2014-02-02 MED ORDER — PROPOFOL 10 MG/ML IV BOLUS
INTRAVENOUS | Status: DC | PRN
  Administered 2014-02-02: 160 mg via INTRAVENOUS

## 2014-02-02 MED ORDER — SUFENTANIL CITRATE 50 MCG/ML IV SOLN
INTRAVENOUS | Status: DC | PRN
  Administered 2014-02-02: 10 ug via INTRAVENOUS
  Administered 2014-02-02: 5 ug via INTRAVENOUS
  Administered 2014-02-02: 20 ug via INTRAVENOUS
  Administered 2014-02-02: 10 ug via INTRAVENOUS

## 2014-02-02 MED ORDER — ONDANSETRON HCL 4 MG PO TABS: 4.0000 mg | ORAL_TABLET | Freq: Four times a day (QID) | ORAL | Status: DC | PRN

## 2014-02-02 MED ORDER — ONDANSETRON HCL 4 MG/2ML IJ SOLN: 4.0000 mg | Freq: Four times a day (QID) | INTRAMUSCULAR | Status: DC | PRN

## 2014-02-02 SURGICAL SUPPLY — 37 items
ADH SKN CLS APL DERMABOND .7 (GAUZE/BANDAGES/DRESSINGS) ×1
BAG SPEC THK2 15X12 ZIP CLS (MISCELLANEOUS)
BAG ZIPLOCK 12X15 (MISCELLANEOUS) IMPLANT
CAPT HIP PF COP ×2 IMPLANT
DERMABOND ADVANCED (GAUZE/BANDAGES/DRESSINGS) ×2
DERMABOND ADVANCED .7 DNX12 (GAUZE/BANDAGES/DRESSINGS) ×1 IMPLANT
DRAPE C-ARM 42X120 X-RAY (DRAPES) ×3 IMPLANT
DRAPE STERI IOBAN 125X83 (DRAPES) ×3 IMPLANT
DRAPE U-SHAPE 47X51 STRL (DRAPES) ×9 IMPLANT
DRSG AQUACEL AG ADV 3.5X 6 (GAUZE/BANDAGES/DRESSINGS) ×2 IMPLANT
DRSG AQUACEL AG ADV 3.5X10 (GAUZE/BANDAGES/DRESSINGS) ×1 IMPLANT
DURAPREP 26ML APPLICATOR (WOUND CARE) ×3 IMPLANT
ELECT BLADE TIP CTD 4 INCH (ELECTRODE) ×3 IMPLANT
ELECT PENCIL ROCKER SW 15FT (MISCELLANEOUS) ×1 IMPLANT
ELECT REM PT RETURN 15FT ADLT (MISCELLANEOUS) ×3 IMPLANT
FACESHIELD WRAPAROUND (MASK) ×6 IMPLANT
FACESHIELD WRAPAROUND OR TEAM (MASK) ×4 IMPLANT
GLOVE BIOGEL PI IND STRL 7.5 (GLOVE) ×1 IMPLANT
GLOVE BIOGEL PI IND STRL 8 (GLOVE) ×1 IMPLANT
GLOVE BIOGEL PI INDICATOR 7.5 (GLOVE) ×8
GLOVE BIOGEL PI INDICATOR 8 (GLOVE) ×4
GLOVE ECLIPSE 8.0 STRL XLNG CF (GLOVE) ×3 IMPLANT
GLOVE ORTHO TXT STRL SZ7.5 (GLOVE) ×6 IMPLANT
GOWN SPEC L3 XXLG W/TWL (GOWN DISPOSABLE) ×5 IMPLANT
GOWN STRL REUS W/TWL LRG LVL3 (GOWN DISPOSABLE) ×5 IMPLANT
KIT BASIN OR (CUSTOM PROCEDURE TRAY) ×3 IMPLANT
PACK TOTAL JOINT (CUSTOM PROCEDURE TRAY) ×3 IMPLANT
PADDING CAST COTTON 6X4 STRL (CAST SUPPLIES) ×3 IMPLANT
SAW OSC TIP CART 19.5X105X1.3 (SAW) ×3 IMPLANT
SUT MNCRL AB 4-0 PS2 18 (SUTURE) ×3 IMPLANT
SUT VIC AB 1 CT1 36 (SUTURE) ×9 IMPLANT
SUT VIC AB 2-0 CT1 27 (SUTURE) ×6
SUT VIC AB 2-0 CT1 TAPERPNT 27 (SUTURE) ×2 IMPLANT
SUT VLOC 180 0 24IN GS25 (SUTURE) ×3 IMPLANT
TOWEL OR 17X26 10 PK STRL BLUE (TOWEL DISPOSABLE) ×3 IMPLANT
TOWEL OR NON WOVEN STRL DISP B (DISPOSABLE) ×2 IMPLANT
TRAY FOLEY CATH 14FRSI W/METER (CATHETERS) ×3 IMPLANT

## 2014-02-02 NOTE — Evaluation (Signed)
Physical Therapy Evaluation Patient Details Name: Tonya Benson MRN: [id number] DOB: August 12, 1946 Today's Date: 02/02/2014   History of Present Illness  R THA -direct anterior approach  Clinical Impression  **Pt is s/p THA resulting in the deficits listed below (see PT Problem List). ** Pt will benefit from skilled PT to increase their independence and safety with mobility to allow discharge to the venue listed below.   Pt walked 73' with RW, participated well with THA exercises. Good progress is expected. Will progress ambulation and exercises and do stair training next session.    *    Follow Up Recommendations Home health PT    Equipment Recommendations  Rolling walker with 5" wheels    Recommendations for Other Services OT consult     Precautions / Restrictions Precautions Precautions: Fall Restrictions Weight Bearing Restrictions: No Other Position/Activity Restrictions: WBAT      Mobility  Bed Mobility Overal bed mobility: Needs Assistance Bed Mobility: Supine to Sit     Supine to sit: Min assist     General bed mobility comments: instructed pt to self assist RLE with LLE, min A to support RLE, VCs technique  Transfers Overall transfer level: Needs assistance Equipment used: Rolling walker (2 wheeled) Transfers: Sit to/from Stand Sit to Stand: Min assist         General transfer comment: VCs hand placement and technique, Min A to rise  Ambulation/Gait Ambulation/Gait assistance: Min guard Ambulation Distance (Feet): 28 Feet Assistive device: Rolling walker (2 wheeled) Gait Pattern/deviations: Step-to pattern;Antalgic;Decreased step length - right     General Gait Details: VCs sequencing, no LOB, good posture  Stairs            Wheelchair Mobility    Modified Rankin (Stroke Patients Only)       Balance Overall balance assessment: Needs assistance   Sitting balance-Leahy Scale: Good     Standing balance support: Bilateral upper  extremity supported Standing balance-Leahy Scale: Poor Standing balance comment: limited by pain in R hip                             Pertinent Vitals/Pain *5/10 R hip Premedicated, ice applied**    Home Living Family/patient expects to be discharged to:: Private residence Living Arrangements: Spouse/significant other Available Help at Discharge: Family;Available 24 hours/day Type of Home: House Home Access: Stairs to enter Entrance Stairs-Rails: Psychiatric nurse of Steps: 3 Home Layout: Two level;Able to live on main level with bedroom/bathroom Home Equipment: Kasandra Knudsen - single point;Shower seat      Prior Function Level of Independence: Independent               Hand Dominance        Extremity/Trunk Assessment   Upper Extremity Assessment: Overall WFL for tasks assessed           Lower Extremity Assessment: RLE deficits/detail RLE Deficits / Details: R hip AAROM decreased 50% due to pain, ankle 5/5, knee 3/5    Cervical / Trunk Assessment: Normal  Communication   Communication: No difficulties  Cognition Arousal/Alertness: Awake/alert Behavior During Therapy: WFL for tasks assessed/performed Overall Cognitive Status: Within Functional Limits for tasks assessed                      General Comments      Exercises Total Joint Exercises Ankle Circles/Pumps: AROM;Both;15 reps;Supine Quad Sets: AROM;Right;5 reps;Supine Heel Slides: AAROM;Right;10 reps;Supine Hip ABduction/ADduction: AAROM;Right;10 reps;Supine  Assessment/Plan    PT Assessment Patient needs continued PT services  PT Diagnosis Difficulty walking;Acute pain   PT Problem List Decreased strength;Decreased range of motion;Decreased activity tolerance;Decreased mobility;Pain;Decreased knowledge of use of DME  PT Treatment Interventions DME instruction;Gait training;Stair training;Functional mobility training;Therapeutic activities;Therapeutic  exercise;Patient/family education   PT Goals (Current goals can be found in the Care Plan section) Acute Rehab PT Goals Patient Stated Goal: doing crafts, likes to read, walk on treadmill PT Goal Formulation: With patient/family Time For Goal Achievement: 02/16/14 Potential to Achieve Goals: Good    Frequency 7X/week   Barriers to discharge        Co-evaluation               End of Session Equipment Utilized During Treatment: Gait belt Activity Tolerance: Patient tolerated treatment well Patient left: in chair;with call bell/phone within reach;with family/visitor present Nurse Communication: Mobility status         Time: 1450-1515 PT Time Calculation (min): 25 min   Charges:   PT Evaluation $Initial PT Evaluation Tier I: 1 Procedure PT Treatments $Gait Training: 8-22 mins $Therapeutic Exercise: 8-22 mins   PT G CodesLucile Benson 02/02/2014, 3:23 PM [phone]

## 2014-02-02 NOTE — Progress Notes (Signed)
CARE MANAGEMENT NOTE 02/02/2014  Patient:  Tonya Benson, Tonya Benson   Account Number:  [id number]  Date Initiated:  02/02/2014  Documentation initiated by:  Paisly Fingerhut  Subjective/Objective Assessment:   operative day for pt with rt total hip replacement     Action/Plan:   will follow for hhc and dme needs   Anticipated DC Date:  02/05/2014   Anticipated DC Plan:  [place]         Choice offered to / List presented to:             Status of service:  In process, will continue to follow Medicare Important Message given?   (If response is "NO", the following Medicare IM given date fields will be blank) Date Medicare IM given:   Date Additional Medicare IM given:    Discharge Disposition:    Per UR Regulation:  Reviewed for med. necessity/level of care/duration of stay  If discussed at [place] of Stay Meetings, dates discussed:    Comments:  [id number]/Eran Mistry Rosana Hoes RN, BSN, [place], [phone] Chart reviewed for update of needs and condition./

## 2014-02-02 NOTE — Interval H&P Note (Signed)
History and Physical Interval Note:  02/02/2014 6:53 AM  Tonya Benson  has presented today for surgery, with the diagnosis of RIGHT HIP OA   The various methods of treatment have been discussed with the patient and family. After consideration of risks, benefits and other options for treatment, the patient has consented to  Procedure(s): RIGHT TOTAL HIP ARTHROPLASTY ANTERIOR APPROACH (Right) as a surgical intervention .  The patient's history has been reviewed, patient examined, no change in status, stable for surgery.  I have reviewed the patient's chart and labs.  Questions were answered to the patient's satisfaction.     Mauri Pole

## 2014-02-02 NOTE — Op Note (Signed)
NAME:  Tonya Benson                ACCOUNT NO.: [id number]      MEDICAL RECORD NO.: [id number]      FACILITY:  [facility]      PHYSICIAN:  Mauri Pole  DATE OF BIRTH:  08-Jul-1946     DATE OF PROCEDURE:  02/02/2014                                 OPERATIVE REPORT         PREOPERATIVE DIAGNOSIS: Right  hip osteoarthritis.      POSTOPERATIVE DIAGNOSIS:  Right hip osteoarthritis.      PROCEDURE:  Right total hip replacement through an anterior approach   utilizing DePuy THR system, component size 77mm pinnacle cup, a size 32+4 neutral   Altrex liner, a size 6 Hi Tri Lock stem with a 32+1 delta ceramic   ball.      SURGEON:  Pietro Cassis. Alvan Dame, M.D.      ASSISTANT:  Molli Barrows, PA-C      ANESTHESIA:  General.      SPECIMENS:  None.      COMPLICATIONS:  None.      BLOOD LOSS:  400 cc     DRAINS:  None     INDICATION OF THE PROCEDURE:  Tonya Benson is a 68 y.o. female who had   presented to office for evaluation of right hip pain.  Radiographs revealed   progressive degenerative changes with bone-on-bone   articulation to the  hip joint.  The patient had painful limited range of   motion significantly affecting their overall quality of life.  The patient was failing to    respond to conservative measures, and at this point was ready   to proceed with more definitive measures.  The patient has noted progressive   degenerative changes in his hip, progressive problems and dysfunction   with regarding the hip prior to surgery.  Consent was obtained for   benefit of pain relief.  Specific risk of infection, DVT, component   failure, dislocation, need for revision surgery, as well discussion of   the anterior versus posterior approach were reviewed.  Consent was   obtained for benefit of anterior pain relief through an anterior   approach.      PROCEDURE IN DETAIL:  The patient was brought to operative theater.   Once adequate anesthesia, preoperative  antibiotics, 2gm of Ancef administered.   The patient was positioned supine on the OSI Hanna table.  Once adequate   padding of boney process was carried out, we had predraped out the hip, and  used fluoroscopy to confirm orientation of the pelvis and position.      The right hip was then prepped and draped from proximal iliac crest to   mid thigh with shower curtain technique.      Time-out was performed identifying the patient, planned procedure, and   extremity.     An incision was then made 2 cm distal and lateral to the   anterior superior iliac spine extending over the orientation of the   tensor fascia lata muscle and sharp dissection was carried down to the   fascia of the muscle and protractor placed in the soft tissues.      The fascia was then incised.  The muscle belly was identified and  swept   laterally and retractor placed along the superior neck.  Following   cauterization of the circumflex vessels and removing some pericapsular   fat, a second cobra retractor was placed on the inferior neck.  A third   retractor was placed on the anterior acetabulum after elevating the   anterior rectus.  A L-capsulotomy was along the line of the   superior neck to the trochanteric fossa, then extended proximally and   distally.  Tag sutures were placed and the retractors were then placed   intracapsular.  We then identified the trochanteric fossa and   orientation of my neck cut, confirmed this radiographically   and then made a neck osteotomy with the femur on traction.  The femoral   head was removed without difficulty or complication.  Traction was let   off and retractors were placed posterior and anterior around the   acetabulum.      The labrum and foveal tissue were debrided.  I began reaming with a 33mm   reamer and reamed up to 29mm reamer with good bony bed preparation and a 50   cup was chosen.  The final 48mm Pinnacle cup was then impacted under fluoroscopy  to confirm  the depth of penetration and orientation with respect to   abduction.  A screw was placed followed by the hole eliminator.  The final   32+4 neutral Altrex liner was impacted with good visualized rim fit.  The cup was positioned anatomically within the acetabular portion of the pelvis.      At this point, the femur was rolled at 80 degrees.  Further capsule was   released off the inferior aspect of the femoral neck.  I then   released the superior capsule proximally.  The hook was placed laterally   along the femur and elevated manually and held in position with the bed   hook.  The leg was then extended and adducted with the leg rolled to 100   degrees of external rotation.  Once the proximal femur was fully   exposed, I used a box osteotome to set orientation.  I then began   broaching with the starting chili pepper broach and passed this by hand and then broached up to 6.  With the 6 broach in place I chose a high offset neck and did trial reductions.  The offset was appropriate, leg lengths   appeared to be equal, confirmed radiographically.   Given these findings, I went ahead and dislocated the hip, repositioned all   retractors and positioned the right hip in the extended and abducted position.  The final 6 Hi Tri Lock stem was   chosen and it was impacted down to the level of neck cut.  Based on this   and the trial reduction, a 32+1 delta ceramic ball was chosen and   impacted onto a clean and dry trunnion, and the hip was reduced.  The   hip had been irrigated throughout the case again at this point.  I did   reapproximate the superior capsular leaflet to the anterior leaflet   using #1 Vicryl.  The fascia of the   tensor fascia lata muscle was then reapproximated using #1 Vicryl and #o V-lock.  The   remaining wound was closed with 2-0 Vicryl and running 4-0 Monocryl.   The hip was cleaned, dried, and dressed sterilely using Dermabond and   Aquacel dressing.  She was then brought    to recovery  room in stable condition tolerating the procedure well.    Molli Barrows, PA-C was present for the entirety of the case involved from   preoperative positioning, perioperative retractor management, general   facilitation of the case, as well as primary wound closure as assistant.            Pietro Cassis Alvan Dame, M.D.        02/02/2014 8:29 AM

## 2014-02-02 NOTE — Anesthesia Preprocedure Evaluation (Addendum)
Anesthesia Evaluation  Patient identified by MRN, date of birth, ID band Patient awake    Reviewed: Allergy & Precautions, H&P , NPO status , Patient's Chart, lab work & pertinent test results  Airway Mallampati: II  TM Distance: >3 FB Neck ROM: Full    Dental no notable dental hx.    Pulmonary neg pulmonary ROS,  breath sounds clear to auscultation  Pulmonary exam normal       Cardiovascular negative cardio ROS  Rhythm:Regular Rate:Normal     Neuro/Psych negative neurological ROS  negative psych ROS   GI/Hepatic negative GI ROS, Neg liver ROS,   Endo/Other  negative endocrine ROS  Renal/GU negative Renal ROS  negative genitourinary   Musculoskeletal negative musculoskeletal ROS (+)   Abdominal   Peds negative pediatric ROS (+)  Hematology negative hematology ROS (+)   Anesthesia Other Findings   Reproductive/Obstetrics negative OB ROS                            Anesthesia Physical Anesthesia Plan  ASA: II  Anesthesia Plan: General   Post-op Pain Management:    Induction: Intravenous  Airway Management Planned: Oral ETT  Additional Equipment:   Intra-op Plan:   Post-operative Plan: Extubation in OR  Informed Consent: I have reviewed the patients History and Physical, chart, labs and discussed the procedure including the risks, benefits and alternatives for the proposed anesthesia with the patient or authorized representative who has indicated his/her understanding and acceptance.   Dental advisory given  Plan Discussed with: CRNA and Surgeon  Anesthesia Plan Comments:         Anesthesia Quick Evaluation  

## 2014-02-02 NOTE — Transfer of Care (Signed)
Immediate Anesthesia Transfer of Care Note  Patient: Tonya Benson  Procedure(s) Performed: Procedure(s): RIGHT TOTAL HIP ARTHROPLASTY ANTERIOR APPROACH (Right)  Patient Location: PACU  Anesthesia Type:General  Level of Consciousness: awake and alert   Airway & Oxygen Therapy: Patient Spontanous Breathing and Patient connected to face mask oxygen  Post-op Assessment: Report given to PACU RN and Post -op Vital signs reviewed and stable  Post vital signs: Reviewed and stable  Complications: No apparent anesthesia complications

## 2014-02-02 NOTE — Anesthesia Postprocedure Evaluation (Signed)
  Anesthesia Post-op Note  Patient: Tonya Benson  Procedure(s) Performed: Procedure(s) (LRB): RIGHT TOTAL HIP ARTHROPLASTY ANTERIOR APPROACH (Right)  Patient Location: PACU  Anesthesia Type: General  Level of Consciousness: awake and alert   Airway and Oxygen Therapy: Patient Spontanous Breathing  Post-op Pain: mild  Post-op Assessment: Post-op Vital signs reviewed, Patient's Cardiovascular Status Stable, Respiratory Function Stable, Patent Airway and No signs of Nausea or vomiting  Last Vitals:  Filed Vitals:   02/02/14 1304  BP: 107/71  Pulse: 97  Temp: 36.6 C  Resp: 18    Post-op Vital Signs: stable   Complications: No apparent anesthesia complications

## 2014-02-02 NOTE — Anesthesia Procedure Notes (Signed)
Procedure Name: Intubation Date/Time: 02/02/2014 7:19 AM Performed by: Danley Danker L Patient Re-evaluated:Patient Re-evaluated prior to inductionOxygen Delivery Method: Circle system utilized Preoxygenation: Pre-oxygenation with 100% oxygen Intubation Type: IV induction Ventilation: Mask ventilation without difficulty and Oral airway inserted - appropriate to patient size Laryngoscope Size: Sabra Heck and 2 Grade View: Grade I Tube type: Oral Tube size: 7.5 mm Number of attempts: 1 Airway Equipment and Method: Stylet Placement Confirmation: ETT inserted through vocal cords under direct vision,  breath sounds checked- equal and bilateral and positive ETCO2 Secured at: 21 cm Tube secured with: Tape Dental Injury: Teeth and Oropharynx as per pre-operative assessment

## 2014-02-03 DIAGNOSIS — E663 Overweight: Secondary | ICD-10-CM | POA: Diagnosis present

## 2014-02-03 DIAGNOSIS — D500 Iron deficiency anemia secondary to blood loss (chronic): Secondary | ICD-10-CM | POA: Diagnosis not present

## 2014-02-03 LAB — CBC
HCT: 31.4 % — ABNORMAL LOW (ref 36.0–46.0)
Hemoglobin: 10.4 g/dL — ABNORMAL LOW (ref 12.0–15.0)
MCH: 32 pg (ref 26.0–34.0)
MCHC: 33.1 g/dL (ref 30.0–36.0)
MCV: 96.6 fL (ref 78.0–100.0)
Platelets: 147 10*3/uL — ABNORMAL LOW (ref 150–400)
RBC: 3.25 MIL/uL — AB (ref 3.87–5.11)
RDW: 12.5 % (ref 11.5–15.5)
WBC: 9.3 10*3/uL (ref 4.0–10.5)

## 2014-02-03 LAB — BASIC METABOLIC PANEL
BUN: 20 mg/dL (ref 6–23)
CHLORIDE: 104 meq/L (ref 96–112)
CO2: 25 meq/L (ref 19–32)
CREATININE: 0.87 mg/dL (ref 0.50–1.10)
Calcium: 8.4 mg/dL (ref 8.4–10.5)
GFR calc Af Amer: 79 mL/min — ABNORMAL LOW (ref >90)
GFR calc non Af Amer: 68 mL/min — ABNORMAL LOW (ref >90)
Glucose, Bld: 124 mg/dL — ABNORMAL HIGH (ref 70–99)
Potassium: 4.3 mEq/L (ref 3.7–5.3)
Sodium: 138 mEq/L (ref 137–147)

## 2014-02-03 MED ORDER — POLYETHYLENE GLYCOL 3350 17 G PO PACK: 17.0000 g | PACK | Freq: Every day | ORAL | Status: DC | PRN

## 2014-02-03 MED ORDER — CELECOXIB 200 MG PO CAPS
200.0000 mg | ORAL_CAPSULE | Freq: Two times a day (BID) | ORAL | Status: DC
  Administered 2014-02-03: 200 mg via ORAL
  Filled 2014-02-03 (×2): qty 1

## 2014-02-03 MED ORDER — FERROUS SULFATE 325 (65 FE) MG PO TABS: 325.0000 mg | ORAL_TABLET | Freq: Three times a day (TID) | ORAL | Status: DC

## 2014-02-03 MED ORDER — METHOCARBAMOL 500 MG PO TABS: 500.0000 mg | ORAL_TABLET | Freq: Four times a day (QID) | ORAL | Status: DC | PRN

## 2014-02-03 MED ORDER — HYDROCODONE-ACETAMINOPHEN 7.5-325 MG PO TABS: 1.0000 | ORAL_TABLET | ORAL | Status: DC | PRN

## 2014-02-03 MED ORDER — ASPIRIN 325 MG PO TBEC: 325.0000 mg | DELAYED_RELEASE_TABLET | Freq: Two times a day (BID) | ORAL | Status: AC

## 2014-02-03 MED ORDER — DSS 100 MG PO CAPS: 100.0000 mg | ORAL_CAPSULE | Freq: Two times a day (BID) | ORAL | Status: DC

## 2014-02-03 NOTE — Progress Notes (Signed)
CSW received referral for New SNF.  CSW reviewed chart and noted that PT recommending Home Health PT and MD note indicates discharge plan is home with [facility].   No social work needs identified.  CSW signing off.  Please re-consult if social work needs arise.  Alison Murray, MSW, [place] [employer] [phone]

## 2014-02-03 NOTE — Progress Notes (Signed)
Physical Therapy Treatment Patient Details Name: Tonya Benson MRN: [id number] DOB: 03-Jul-1946 Today's Date: 02/03/2014    History of Present Illness R THA -direct anterior approach    PT Comments    *PT Goals met, pt is ready to DC home from PT standpoint. **  Follow Up Recommendations  Home health PT     Equipment Recommendations  Rolling walker with 5" wheels    Recommendations for Other Services OT consult     Precautions / Restrictions Precautions Precautions: Fall Restrictions Weight Bearing Restrictions: No Other Position/Activity Restrictions: WBAT    Mobility  Bed Mobility Overal bed mobility: Modified Independent Bed Mobility: Supine to Sit     Supine to sit: Modified independent (Device/Increase time)     General bed mobility comments: crossed LLE under R  Transfers Overall transfer level: Modified independent Equipment used: Rolling walker (2 wheeled) Transfers: Sit to/from Stand Sit to Stand: Modified independent (Device/Increase time)         General transfer comment: with UE support  Ambulation/Gait Ambulation/Gait assistance: Modified independent (Device/Increase time) Ambulation Distance (Feet): 400 Feet Assistive device: Rolling walker (2 wheeled) Gait Pattern/deviations: Step-to pattern     General Gait Details: Steady, no LOB, good sequencing and posture   Stairs Stairs: Yes Stairs assistance: Supervision Stair Management: One rail Right;Sideways;Step to pattern Number of Stairs: 3 General stair comments: VCs for sequencing, husband present for stair training  Wheelchair Mobility    Modified Rankin (Stroke Patients Only)       Balance                                    Cognition Arousal/Alertness: Awake/alert Behavior During Therapy: WFL for tasks assessed/performed Overall Cognitive Status: Within Functional Limits for tasks assessed                      Exercises Total Joint  Exercises Ankle Circles/Pumps: AROM;Both;15 reps;Supine Quad Sets: AROM;Right;5 reps;Supine Short Arc Quad: AROM;Right;20 reps;Supine Heel Slides: AAROM;Right;10 reps;Supine Hip ABduction/ADduction: AAROM;Right;10 reps;Supine;Standing (10 reps supine, 10 reps standing) Long Arc Quad: AROM;10 reps;Right;Seated Marching in Standing: AROM;Right;10 reps;Standing Standing Hip Extension: AROM;Right;10 reps;Standing    General Comments        Pertinent Vitals/Pain **3/10 R hip with walking Premedicated, ice applied*    Home Living Family/patient expects to be discharged to:: Private residence Living Arrangements: Spouse/significant other Available Help at Discharge: Family;Available 24 hours/day Type of Home: House Home Access: Stairs to enter Entrance Stairs-Rails: Right;Left Home Layout: Two level;Able to live on main level with bedroom/bathroom Home Equipment: Tub bench;Cane - single point      Prior Function Level of Independence: Independent          PT Goals (current goals can now be found in the care plan section) Acute Rehab PT Goals Patient Stated Goal: doing crafts, likes to read, walk on treadmill PT Goal Formulation: With patient/family Time For Goal Achievement: 02/16/14 Potential to Achieve Goals: Good Progress towards PT goals: Goals met/education completed, patient discharged from PT    Frequency  7X/week    PT Plan Current plan remains appropriate    Co-evaluation             End of Session   Activity Tolerance: Patient tolerated treatment well Patient left: in chair;with call bell/phone within reach;with family/visitor present     Time: 7342-8768 PT Time Calculation (min): 29 min  Charges:  $Gait  Training: 8-22 mins $Therapeutic Exercise: 8-22 mins                    G Codes:      [name] 02/03/2014, 9:24 AM [phone]

## 2014-02-03 NOTE — Plan of Care (Signed)
Problem: Consults Goal: Diagnosis- Total Joint Replacement Outcome: Completed/Met Date Met:  02/03/14 Primary Total Hip RIGHT, Anterior  Problem: Phase III Progression Outcomes Goal: Anticoagulant follow-up in place Outcome: Not Applicable Date Met:  58/34/62 ASA for VTE. No f/u needed.

## 2014-02-03 NOTE — Evaluation (Signed)
Occupational Therapy Evaluation Patient Details Name: Tonya Benson MRN: [id number] DOB: 1946-06-23 Today's Date: 02/03/2014    History of Present Illness R THA -direct anterior approach   Clinical Impression   This 68 year old female was admitted for the above surgery. All education was completed.  She does not need any further OT at this time.    Follow Up Recommendations  No OT follow up    Equipment Recommendations  None recommended by OT (can borrow 3:1 from brother)    Recommendations for Other Services       Precautions / Restrictions Precautions Precautions: Fall Restrictions Weight Bearing Restrictions: No Other Position/Activity Restrictions: WBAT      Mobility Bed Mobility   Bed Mobility: Supine to Sit     Supine to sit: Supervision     General bed mobility comments: crossed LLE under R  Transfers     Transfers: Sit to/from Stand Sit to Stand: Supervision         General transfer comment: cues for UE/LE position    Balance                                            ADL Overall ADL's : Needs assistance/impaired                     Lower Body Dressing: Minimal assistance;Sit to/from stand   Toilet Transfer: Min guard;BSC;Ambulation   Toileting- Water quality scientist and Hygiene: Supervision/safety;Sit to/from stand       Functional mobility during ADLs: Min guard;Rolling walker General ADL Comments: completed adl at sink, sit to stand.  Pt supervison when standing and set up for LB bathing.  Donned pants with occasional min A and needs min A also for socks  Pt recently had high commodes installed.  Our comfort height felt too low.  She has access to 3:1 at brothers--recommended she borrow this.       Vision                     Perception     Praxis      Pertinent Vitals/Pain R hip sore--PT took over immediately after OT session     Hand Dominance     Extremity/Trunk Assessment Upper  Extremity Assessment Upper Extremity Assessment: Overall WFL for tasks assessed           Communication Communication Communication: No difficulties   Cognition Arousal/Alertness: Awake/alert Behavior During Therapy: WFL for tasks assessed/performed Overall Cognitive Status: Within Functional Limits for tasks assessed                     General Comments       Exercises       Shoulder Instructions      Home Living Family/patient expects to be discharged to:: Private residence Living Arrangements: Spouse/significant other Available Help at Discharge: Family;Available 24 hours/day Type of Home: House Home Access: Stairs to enter [employer] of Steps: 3 Entrance Stairs-Rails: Right;Left Home Layout: Two level;Able to live on main level with bedroom/bathroom     Bathroom Shower/Tub: Tub/shower unit Shower/tub characteristics: Curtain Biochemist, clinical: Handicapped height     Home Equipment: Tub bench;Cane - single point          Prior Functioning/Environment Level of Independence: Independent  OT Diagnosis:     OT Problem List:     OT Treatment/Interventions:      OT Goals(Current goals can be found in the care plan section)    OT Frequency:     Barriers to D/C:            Co-evaluation              End of Session    Activity Tolerance: Patient tolerated treatment well Patient left:  (handed off to PT)   Time: 3785-8850 OT Time Calculation (min): 21 min Charges:  OT General Charges $OT Visit: 1 Procedure OT Evaluation $Initial OT Evaluation Tier I: 1 Procedure OT Treatments $Self Care/Home Management : 8-22 mins G-Codes:    Lesle Chris 02/27/2014, 9:02 AM  Lesle Chris, OTR/L [phone] 02-27-2014 Lesle Chris, OTR/L [phone] 02/27/2014

## 2014-02-03 NOTE — Progress Notes (Signed)
[facility]  [facility] is providing the following services: RW and Commode  If patient discharges after hours, please call [phone].   Linward Headland 02/03/2014, 10:31 AM

## 2014-02-03 NOTE — Progress Notes (Signed)
   Subjective: 1 Day Post-Op Procedure(s) (LRB): RIGHT TOTAL HIP ARTHROPLASTY ANTERIOR APPROACH (Right)   Patient reports pain as mild, pain controlled. No events throughout the night. Ready to be discharged home if she does well with PT and pain controlled.   Objective:   VITALS:   Filed Vitals:   02/03/14 0830  BP: 108/70  Pulse: 66  Temp: 97.6 F (36.4 C)  Resp: 16    Neurovascular intact Dorsiflexion/Plantar flexion intact Incision: dressing C/D/I No cellulitis present Compartment soft  LABS  Recent Labs  02/03/14 0407  HGB 10.4*  HCT 31.4*  WBC 9.3  PLT 147*     Recent Labs  02/03/14 0407  NA 138  K 4.3  BUN 20  CREATININE 0.87  GLUCOSE 124*     Assessment/Plan: 1 Day Post-Op Procedure(s) (LRB): RIGHT TOTAL HIP ARTHROPLASTY ANTERIOR APPROACH (Right) Foley cath d/c'ed Advance diet Up with therapy D/C IV fluids Discharge home with home health Follow up in 2 weeks at [facility]. Follow up with OLIN,Myrl Lazarus D in 2 weeks.  Contact information:  [facility] [address] [place] [phone]    Expected ABLA  Treated with iron and will observe  Overweight (BMI 25-29.9) Estimated body mass index is 29.12 kg/(m^2) as calculated from the following:   Height as of this encounter: 5\' 7"  (1.702 m).   Weight as of this encounter: 84.369 kg (186 lb). Patient also counseled that weight may inhibit the healing process Patient counseled that losing weight will help with future health issues      West Pugh. Micheil Klaus   PAC  02/03/2014, 10:01 AM

## 2014-02-04 DIAGNOSIS — Z96649 Presence of unspecified artificial hip joint: Secondary | ICD-10-CM | POA: Diagnosis not present

## 2014-02-04 DIAGNOSIS — Z471 Aftercare following joint replacement surgery: Secondary | ICD-10-CM | POA: Diagnosis not present

## 2014-02-04 DIAGNOSIS — Z4801 Encounter for change or removal of surgical wound dressing: Secondary | ICD-10-CM | POA: Diagnosis not present

## 2014-02-04 DIAGNOSIS — Z7982 Long term (current) use of aspirin: Secondary | ICD-10-CM | POA: Diagnosis not present

## 2014-02-04 DIAGNOSIS — IMO0001 Reserved for inherently not codable concepts without codable children: Secondary | ICD-10-CM | POA: Diagnosis not present

## 2014-02-05 NOTE — Discharge Summary (Signed)
Physician Discharge Summary  Patient ID: Tonya Benson MRN: [id number] DOB/AGE: 11-15-1945 68 y.o.  Admit date: 02/02/2014 Discharge date: 02/03/2014   Procedures:  Procedure(s) (LRB): RIGHT TOTAL HIP ARTHROPLASTY ANTERIOR APPROACH (Right)  Attending Physician:  Dr. Paralee Cancel   Admission Diagnoses:   Right hip OA / pain  Discharge Diagnoses:  Principal Problem:   S/P right THA, AA Active Problems:   Overweight (BMI 25.0-29.9)   Expected blood loss anemia  Past Medical History  Diagnosis Date  . Heart murmur     slight  . GERD (gastroesophageal reflux disease)     occasional  . Headache(784.0)     in the past, none recent  . Arthritis   . Cancer 2006    right breast- over 30 radiation treatments    HPI: Tonya Benson, 68 y.o. female, has a history of pain and functional disability in the right hip(s) due to arthritis and patient has failed non-surgical conservative treatments for greater than 12 weeks to include NSAID's and/or analgesics and activity modification. Onset of symptoms was gradual starting 2+ years ago with gradually worsening course since that time.The patient noted no past surgery on the right hip(s). Patient currently rates pain in the right hip at 10 out of 10 with activity. Patient has night pain, worsening of pain with activity and weight bearing, trendelenberg gait, pain that interfers with activities of daily living and pain with passive range of motion. Patient has evidence of periarticular osteophytes and joint space narrowing by imaging studies. This condition presents safety issues increasing the risk of falls. There is no current active infection. Risks, benefits and expectations were discussed with the patient. Risks including but not limited to the risk of anesthesia, blood clots, nerve damage, blood vessel damage, failure of the prosthesis, infection and up to and including death. Patient understand the risks, benefits and expectations and wishes  to proceed with surgery.   PCP: Henrine Screws, MD   Discharged Condition: good  Hospital Course:  Patient underwent the above stated procedure on 02/02/2014. Patient tolerated the procedure well and brought to the recovery room in good condition and subsequently to the floor.  POD #1 BP: 108/70 ; Pulse: 66 ; Temp: 97.6 F (36.4 C) ; Resp: 16 Patient reports pain as mild, pain controlled. No events throughout the night. Ready to be discharged home. Neurovascular intact, dorsiflexion/plantar flexion intact, incision: dressing C/D/I, no cellulitis present and compartment soft.   LABS  Basename    HGB  10.4  HCT  31.4    Discharge Exam: General appearance: alert, cooperative and no distress Extremities: Homans sign is negative, no sign of DVT, no edema, redness or tenderness in the calves or thighs and no ulcers, gangrene or trophic changes  Disposition: Home with follow up in 2 weeks   Follow-up Information   Follow up with Mauri Pole, MD. Schedule an appointment as soon as possible for a visit in 2 weeks.   Specialty:  Orthopedic Surgery   Contact information:   [address] [place] [address] [place] [address] [phone]       Discharge Orders   Future Orders Complete By Expires   Call MD / Call 911  As directed    Change dressing  As directed    Constipation Prevention  As directed    Diet - low sodium heart healthy  As directed    Discharge instructions  As directed    Increase activity slowly as tolerated  As directed  TED hose  As directed    Weight bearing as tolerated  As directed    Questions:     Laterality:     Extremity:          Medication List    STOP taking these medications       ciprofloxacin 500 MG tablet  Commonly known as:  CIPRO      TAKE these medications       aspirin 325 MG EC tablet  Take 1 tablet (325 mg total) by mouth 2 (two) times daily.     celecoxib 200 MG capsule  Commonly known as:  CELEBREX  Take 200  mg by mouth daily.     cholecalciferol 1000 UNITS tablet  Commonly known as:  VITAMIN D  Take 1,000 Units by mouth daily.     diphenhydrAMINE 50 MG capsule  Commonly known as:  BENADRYL  Take 50 mg by mouth at bedtime as needed for sleep.     DSS 100 MG Caps  Take 100 mg by mouth 2 (two) times daily.     esomeprazole 20 MG capsule  Commonly known as:  NEXIUM  Take 20 mg by mouth as needed.     ferrous sulfate 325 (65 FE) MG tablet  Take 1 tablet (325 mg total) by mouth 3 (three) times daily after meals.     HYDROcodone-acetaminophen 7.5-325 MG per tablet  Commonly known as:  NORCO  Take 1-2 tablets by mouth every 4 (four) hours as needed for moderate pain.     methocarbamol 500 MG tablet  Commonly known as:  ROBAXIN  Take 1 tablet (500 mg total) by mouth every 6 (six) hours as needed for muscle spasms.     polyethylene glycol packet  Commonly known as:  MIRALAX / GLYCOLAX  Take 17 g by mouth daily as needed for mild constipation.     valACYclovir 1000 MG tablet  Commonly known as:  VALTREX  Take 500 mg by mouth daily. Takes 1/2 tablet         Signed: West Pugh. Eliakim Tendler   PAC  02/05/2014, 11:41 AM

## 2014-02-08 DIAGNOSIS — IMO0001 Reserved for inherently not codable concepts without codable children: Secondary | ICD-10-CM | POA: Diagnosis not present

## 2014-02-08 DIAGNOSIS — Z471 Aftercare following joint replacement surgery: Secondary | ICD-10-CM | POA: Diagnosis not present

## 2014-02-08 DIAGNOSIS — Z4801 Encounter for change or removal of surgical wound dressing: Secondary | ICD-10-CM | POA: Diagnosis not present

## 2014-02-08 DIAGNOSIS — Z7982 Long term (current) use of aspirin: Secondary | ICD-10-CM | POA: Diagnosis not present

## 2014-02-08 DIAGNOSIS — Z96649 Presence of unspecified artificial hip joint: Secondary | ICD-10-CM | POA: Diagnosis not present

## 2014-02-10 DIAGNOSIS — Z4801 Encounter for change or removal of surgical wound dressing: Secondary | ICD-10-CM | POA: Diagnosis not present

## 2014-02-10 DIAGNOSIS — Z96649 Presence of unspecified artificial hip joint: Secondary | ICD-10-CM | POA: Diagnosis not present

## 2014-02-10 DIAGNOSIS — Z7982 Long term (current) use of aspirin: Secondary | ICD-10-CM | POA: Diagnosis not present

## 2014-02-10 DIAGNOSIS — IMO0001 Reserved for inherently not codable concepts without codable children: Secondary | ICD-10-CM | POA: Diagnosis not present

## 2014-02-10 DIAGNOSIS — Z471 Aftercare following joint replacement surgery: Secondary | ICD-10-CM | POA: Diagnosis not present

## 2014-02-12 DIAGNOSIS — IMO0001 Reserved for inherently not codable concepts without codable children: Secondary | ICD-10-CM | POA: Diagnosis not present

## 2014-02-12 DIAGNOSIS — Z4801 Encounter for change or removal of surgical wound dressing: Secondary | ICD-10-CM | POA: Diagnosis not present

## 2014-02-12 DIAGNOSIS — Z471 Aftercare following joint replacement surgery: Secondary | ICD-10-CM | POA: Diagnosis not present

## 2014-02-12 DIAGNOSIS — Z7982 Long term (current) use of aspirin: Secondary | ICD-10-CM | POA: Diagnosis not present

## 2014-02-12 DIAGNOSIS — Z96649 Presence of unspecified artificial hip joint: Secondary | ICD-10-CM | POA: Diagnosis not present

## 2014-03-19 DIAGNOSIS — Z9660 Presence of unspecified orthopedic joint implant: Secondary | ICD-10-CM | POA: Diagnosis not present

## 2014-05-18 DIAGNOSIS — H52209 Unspecified astigmatism, unspecified eye: Secondary | ICD-10-CM | POA: Diagnosis not present

## 2014-05-18 DIAGNOSIS — H524 Presbyopia: Secondary | ICD-10-CM | POA: Diagnosis not present

## 2014-05-18 DIAGNOSIS — H2510 Age-related nuclear cataract, unspecified eye: Secondary | ICD-10-CM | POA: Diagnosis not present

## 2014-05-27 ENCOUNTER — Other Ambulatory Visit: Payer: Self-pay

## 2014-05-27 DIAGNOSIS — Z1231 Encounter for screening mammogram for malignant neoplasm of breast: Secondary | ICD-10-CM

## 2014-06-15 ENCOUNTER — Ambulatory Visit
Admission: RE | Admit: 2014-06-15 | Discharge: 2014-06-15 | Disposition: A | Discharge status: Home / Self Care | Payer: Medicare Other | Source: Ambulatory Visit

## 2014-06-15 DIAGNOSIS — Z1231 Encounter for screening mammogram for malignant neoplasm of breast: Secondary | ICD-10-CM | POA: Diagnosis not present

## 2014-07-16 DIAGNOSIS — K219 Gastro-esophageal reflux disease without esophagitis: Secondary | ICD-10-CM | POA: Diagnosis not present

## 2014-07-16 DIAGNOSIS — E785 Hyperlipidemia, unspecified: Secondary | ICD-10-CM | POA: Diagnosis not present

## 2014-07-16 DIAGNOSIS — B009 Herpesviral infection, unspecified: Secondary | ICD-10-CM | POA: Diagnosis not present

## 2014-07-16 DIAGNOSIS — M170 Bilateral primary osteoarthritis of knee: Secondary | ICD-10-CM | POA: Diagnosis not present

## 2014-07-16 DIAGNOSIS — G43009 Migraine without aura, not intractable, without status migrainosus: Secondary | ICD-10-CM | POA: Diagnosis not present

## 2014-07-16 DIAGNOSIS — Z0000 Encounter for general adult medical examination without abnormal findings: Secondary | ICD-10-CM | POA: Diagnosis not present

## 2014-07-16 DIAGNOSIS — E559 Vitamin D deficiency, unspecified: Secondary | ICD-10-CM | POA: Diagnosis not present

## 2014-07-16 DIAGNOSIS — Z79899 Other long term (current) drug therapy: Secondary | ICD-10-CM | POA: Diagnosis not present

## 2014-07-16 DIAGNOSIS — G4700 Insomnia, unspecified: Secondary | ICD-10-CM | POA: Diagnosis not present

## 2015-01-03 DIAGNOSIS — J069 Acute upper respiratory infection, unspecified: Secondary | ICD-10-CM | POA: Diagnosis not present

## 2015-01-27 DIAGNOSIS — Z96641 Presence of right artificial hip joint: Secondary | ICD-10-CM | POA: Diagnosis not present

## 2015-01-27 DIAGNOSIS — Z471 Aftercare following joint replacement surgery: Secondary | ICD-10-CM | POA: Diagnosis not present

## 2015-02-16 DIAGNOSIS — M65332 Trigger finger, left middle finger: Secondary | ICD-10-CM | POA: Diagnosis not present

## 2015-03-10 ENCOUNTER — Encounter (HOSPITAL_BASED_OUTPATIENT_CLINIC_OR_DEPARTMENT_OTHER): Payer: Self-pay

## 2015-03-10 ENCOUNTER — Ambulatory Visit (HOSPITAL_BASED_OUTPATIENT_CLINIC_OR_DEPARTMENT_OTHER): Admit: 2015-03-10 | Payer: Self-pay | Admitting: Orthopedic Surgery

## 2015-03-10 SURGERY — RELEASE, A1 PULLEY, FOR TRIGGER FINGER
Anesthesia: Choice | Site: Finger | Laterality: Left

## 2015-06-02 ENCOUNTER — Encounter (HOSPITAL_COMMUNITY): Payer: Self-pay | Admitting: *Deleted

## 2015-06-02 ENCOUNTER — Emergency Department (HOSPITAL_COMMUNITY)
Admission: EM | Admit: 2015-06-02 | Discharge: 2015-06-03 | Disposition: A | Discharge status: Home / Self Care | Payer: Medicare Other | Attending: Emergency Medicine | Admitting: Emergency Medicine

## 2015-06-02 DIAGNOSIS — Z79899 Other long term (current) drug therapy: Secondary | ICD-10-CM | POA: Diagnosis not present

## 2015-06-02 DIAGNOSIS — R011 Cardiac murmur, unspecified: Secondary | ICD-10-CM | POA: Insufficient documentation

## 2015-06-02 DIAGNOSIS — R112 Nausea with vomiting, unspecified: Secondary | ICD-10-CM | POA: Diagnosis present

## 2015-06-02 DIAGNOSIS — M1990 Unspecified osteoarthritis, unspecified site: Secondary | ICD-10-CM | POA: Diagnosis not present

## 2015-06-02 DIAGNOSIS — K219 Gastro-esophageal reflux disease without esophagitis: Secondary | ICD-10-CM | POA: Diagnosis not present

## 2015-06-02 DIAGNOSIS — Z853 Personal history of malignant neoplasm of breast: Secondary | ICD-10-CM | POA: Diagnosis not present

## 2015-06-02 DIAGNOSIS — R109 Unspecified abdominal pain: Secondary | ICD-10-CM

## 2015-06-02 DIAGNOSIS — N201 Calculus of ureter: Secondary | ICD-10-CM | POA: Insufficient documentation

## 2015-06-02 DIAGNOSIS — Z791 Long term (current) use of non-steroidal anti-inflammatories (NSAID): Secondary | ICD-10-CM | POA: Diagnosis not present

## 2015-06-02 LAB — CBC
HCT: 41.2 % (ref 36.0–46.0)
Hemoglobin: 14 g/dL (ref 12.0–15.0)
MCH: 32.2 pg (ref 26.0–34.0)
MCHC: 34 g/dL (ref 30.0–36.0)
MCV: 94.7 fL (ref 78.0–100.0)
PLATELETS: 183 10*3/uL (ref 150–400)
RBC: 4.35 MIL/uL (ref 3.87–5.11)
RDW: 12.5 % (ref 11.5–15.5)
WBC: 9.8 10*3/uL (ref 4.0–10.5)

## 2015-06-02 LAB — LIPASE, BLOOD: LIPASE: 24 U/L (ref 22–51)

## 2015-06-02 LAB — COMPREHENSIVE METABOLIC PANEL
ALT: 18 U/L (ref 14–54)
AST: 30 U/L (ref 15–41)
Albumin: 4.2 g/dL (ref 3.5–5.0)
Alkaline Phosphatase: 104 U/L (ref 38–126)
Anion gap: 14 (ref 5–15)
BUN: 32 mg/dL — AB (ref 6–20)
CO2: 21 mmol/L — ABNORMAL LOW (ref 22–32)
Calcium: 9.5 mg/dL (ref 8.9–10.3)
Chloride: 105 mmol/L (ref 101–111)
Creatinine, Ser: 1.53 mg/dL — ABNORMAL HIGH (ref 0.44–1.00)
GFR calc Af Amer: 39 mL/min — ABNORMAL LOW (ref >60)
GFR, EST NON AFRICAN AMERICAN: 34 mL/min — AB (ref >60)
Glucose, Bld: 170 mg/dL — ABNORMAL HIGH (ref 65–99)
Potassium: 4.4 mmol/L (ref 3.5–5.1)
Sodium: 140 mmol/L (ref 135–145)
Total Bilirubin: 0.5 mg/dL (ref 0.3–1.2)
Total Protein: 6.7 g/dL (ref 6.5–8.1)

## 2015-06-02 MED ORDER — FENTANYL CITRATE (PF) 100 MCG/2ML IJ SOLN
INTRAMUSCULAR | Status: AC
  Filled 2015-06-02: qty 2

## 2015-06-02 MED ORDER — FENTANYL CITRATE (PF) 100 MCG/2ML IJ SOLN
50.0000 ug | Freq: Once | INTRAMUSCULAR | Status: AC
  Administered 2015-06-02: 50 ug via NASAL

## 2015-06-02 MED ORDER — ONDANSETRON HCL 4 MG/2ML IJ SOLN
4.0000 mg | Freq: Once | INTRAMUSCULAR | Status: AC
  Administered 2015-06-02: 4 mg via INTRAVENOUS

## 2015-06-02 MED ORDER — ONDANSETRON HCL 4 MG/2ML IJ SOLN
INTRAMUSCULAR | Status: AC
  Filled 2015-06-02: qty 2

## 2015-06-02 NOTE — ED Notes (Signed)
Patient presents c/o sudden lower right side pain after eating a peach.  +vomiting  Unable to obtain temperature

## 2015-06-02 NOTE — ED Notes (Signed)
Patient unable to sit still, skin clammy

## 2015-06-03 ENCOUNTER — Emergency Department (HOSPITAL_COMMUNITY): Payer: Medicare Other

## 2015-06-03 ENCOUNTER — Encounter (HOSPITAL_COMMUNITY): Payer: Self-pay

## 2015-06-03 DIAGNOSIS — N201 Calculus of ureter: Secondary | ICD-10-CM | POA: Diagnosis not present

## 2015-06-03 LAB — URINALYSIS, ROUTINE W REFLEX MICROSCOPIC
BILIRUBIN URINE: NEGATIVE
GLUCOSE, UA: NEGATIVE mg/dL
KETONES UR: 15 mg/dL — AB
NITRITE: NEGATIVE
PH: 5.5 (ref 5.0–8.0)
Protein, ur: NEGATIVE mg/dL
Specific Gravity, Urine: 1.018 (ref 1.005–1.030)
Urobilinogen, UA: 0.2 mg/dL (ref 0.0–1.0)

## 2015-06-03 LAB — URINE MICROSCOPIC-ADD ON

## 2015-06-03 MED ORDER — HYDROMORPHONE HCL 1 MG/ML IJ SOLN
0.5000 mg | Freq: Once | INTRAMUSCULAR | Status: DC
  Filled 2015-06-03: qty 1

## 2015-06-03 MED ORDER — OXYCODONE-ACETAMINOPHEN 5-325 MG PO TABS: 1.0000 | ORAL_TABLET | ORAL | Status: DC | PRN

## 2015-06-03 MED ORDER — SODIUM CHLORIDE 0.9 % IV BOLUS (SEPSIS)
1000.0000 mL | Freq: Once | INTRAVENOUS | Status: AC
  Administered 2015-06-03: 1000 mL via INTRAVENOUS

## 2015-06-03 MED ORDER — ONDANSETRON 8 MG PO TBDP: 8.0000 mg | ORAL_TABLET | Freq: Three times a day (TID) | ORAL | Status: DC | PRN

## 2015-06-03 MED ORDER — HYDROMORPHONE HCL 1 MG/ML IJ SOLN: 1.0000 mg | Freq: Once | INTRAMUSCULAR | Status: DC

## 2015-06-03 NOTE — ED Notes (Signed)
Pt informed that she needs to give urine specimen prior to leaving. Pt ambulated to bathroom to attempt to give urine specimen

## 2015-06-03 NOTE — ED Provider Notes (Signed)
CSN: [id number]     Arrival date & time 06/02/15  2221 History   This chart was scribed for Tonya Schmidt, MD by Forrestine Him, ED Scribe. This patient was seen in room A06C/A06C and the patient's care was started 12:19 AM.   Chief Complaint  Patient presents with  . Side pain   . Nausea  . Emesis   The history is provided by the patient. No language interpreter was used.    HPI Comments: Tonya Benson is a 69 y.o. female with a PMHx of GERD and breast cancer who presents to the Emergency Department complaining of intermittent, ongoing, non-radiating, improving R sided abdominal pain onset few hours prior to arrival. Pt states she noted discomfort after eating a peach this evening. She also reports nausea and vomiting. No aggravating or alleviating factors at this time. No OTC medications or home remedies attempted prior to arrival. No recent fever, chills, diarrhea, chest pain , or shortness of breath. Denies any history of abdominal surgeries. No known allergies to mediations.  Past Medical History  Diagnosis Date  . Heart murmur     slight  . GERD (gastroesophageal reflux disease)     occasional  . Headache(784.0)     in the past, none recent  . Arthritis   . Cancer 2006    right breast- over 30 radiation treatments   Past Surgical History  Procedure Laterality Date  . Breast lumpectomy Right 2006  . Breast biopsy Right 2006  . Total hip arthroplasty Right 02/02/2014    Procedure: RIGHT TOTAL HIP ARTHROPLASTY ANTERIOR APPROACH;  Surgeon: Mauri Pole, MD;  Location: [facility] ORS;  Service: Orthopedics;  Laterality: Right;   No family history on file. Social History  Substance Use Topics  . Smoking status: Never Smoker   . Smokeless tobacco: Never Used  . Alcohol Use: Yes     Comment: 2 glasses a week   OB History    No data available     Review of Systems  Constitutional: Negative for fever and chills.  Respiratory: Negative for cough and shortness of breath.    Cardiovascular: Negative for chest pain.  Gastrointestinal: Positive for nausea, vomiting and abdominal pain. Negative for diarrhea.  Genitourinary: Negative for dysuria and hematuria.  Skin: Negative for rash.  Neurological: Negative for headaches.  Psychiatric/Behavioral: Negative for confusion.  All other systems reviewed and are negative.     Allergies  Adhesive  Home Medications   Prior to Admission medications   Medication Sig Start Date End Date Taking? Authorizing Provider  celecoxib (CELEBREX) 200 MG capsule Take 200 mg by mouth daily.    Historical Provider, MD  cholecalciferol (VITAMIN D) 1000 UNITS tablet Take 1,000 Units by mouth daily.    Historical Provider, MD  diphenhydrAMINE (BENADRYL) 50 MG capsule Take 50 mg by mouth at bedtime as needed for sleep.    Historical Provider, MD  esomeprazole (NEXIUM) 20 MG capsule Take 20 mg by mouth as needed.    Historical Provider, MD  HYDROcodone-acetaminophen (NORCO) 7.5-325 MG per tablet Take 1-2 tablets by mouth every 4 (four) hours as needed for moderate pain. 02/03/14   Danae Orleans, PA-C  methocarbamol (ROBAXIN) 500 MG tablet Take 1 tablet (500 mg total) by mouth every 6 (six) hours as needed for muscle spasms. 02/03/14   Danae Orleans, PA-C  polyethylene glycol (MIRALAX / GLYCOLAX) packet Take 17 g by mouth daily as needed for mild constipation. 02/03/14   Danae Orleans, PA-C  valACYclovir Estell Harpin)  1000 MG tablet Take 500 mg by mouth daily. Takes 1/2 tablet    Historical Provider, MD   Triage Vitals: BP 139/90 mmHg  Pulse 84  Resp 26  SpO2 100%   Physical Exam  Constitutional: She is oriented to person, place, and time. She appears well-developed and well-nourished. No distress.  HENT:  Head: Normocephalic and atraumatic.  Eyes: EOM are normal.  Neck: Normal range of motion.  Cardiovascular: Normal rate, regular rhythm and normal heart sounds.   Pulmonary/Chest: Effort normal and breath sounds normal.   Abdominal: Soft. She exhibits no distension. There is no tenderness.  Musculoskeletal: Normal range of motion.  Neurological: She is alert and oriented to person, place, and time.  Skin: Skin is warm and dry.  Psychiatric: She has a normal mood and affect. Judgment normal.  Nursing note and vitals reviewed.   ED Course  Procedures (including critical care time)  DIAGNOSTIC STUDIES: Oxygen Saturation is 100% on RA, Normal by my interpretation.    COORDINATION OF CARE: 12:23 AM- Will  order lipase, CMP, CBC, urinalysis, and CT renal stone study. Will give Sublimaze, Zofran, fluids, and Dilaudid. Discussed treatment plan with pt at bedside and pt agreed to plan.     Labs Review Labs Reviewed  COMPREHENSIVE METABOLIC PANEL - Abnormal; Notable for the following:    CO2 21 (*)    Glucose, Bld 170 (*)    BUN 32 (*)    Creatinine, Ser 1.53 (*)    GFR calc non Af Amer 34 (*)    GFR calc Af Amer 39 (*)    All other components within normal limits  LIPASE, BLOOD  CBC  URINALYSIS, ROUTINE W REFLEX MICROSCOPIC (NOT AT [facility])    Imaging Review No results found. I have personally reviewed and evaluated these images and lab results as part of my medical decision-making.   EKG Interpretation None      MDM   Final diagnoses:  Abdominal pain  Right ureteral stone    Patient continues to be pain-free at this time.  CT scan is consistent with a right ureteral stone.  Her pain is controlled.  Discharge home in good condition with outpatient urology follow-up.  Strict return precautions given.  She understands to return to the [facility] long emergency department for any new or worsening symptoms   I personally performed the services described in this documentation, which was scribed in my presence. The recorded information has been reviewed and is accurate.      Tonya Schmidt, MD 06/03/15 [phone]

## 2015-06-03 NOTE — ED Notes (Signed)
Pt verbalized understanding of d/c instructions and has no further questions. Pt stable and NAD.  

## 2015-06-03 NOTE — Discharge Instructions (Signed)
Ureteral Colic (Kidney Stones) °Ureteral colic is the result of a condition when kidney stones form inside the kidney. Once kidney stones are formed they may move into the tube that connects the kidney with the bladder (ureter). If this occurs, this condition may cause pain (colic) in the ureter.  °CAUSES  °Pain is caused by stone movement in the ureter and the obstruction caused by the stone. °SYMPTOMS  °The pain comes and goes as the ureter contracts around the stone. The pain is usually intense, sharp, and stabbing in character. The location of the pain may move as the stone moves through the ureter. When the stone is near the kidney the pain is usually located in the back and radiates to the belly (abdomen). When the stone is ready to pass into the bladder the pain is often located in the lower abdomen on the side the stone is located. At this location, the symptoms may mimic those of a urinary tract infection with urinary frequency. Once the stone is located here it often passes into the bladder and the pain disappears completely. °TREATMENT  °· Your caregiver will provide you with medicine for pain relief. °· You may require specialized follow-up X-rays. °· The absence of pain does not always mean that the stone has passed. It may have just stopped moving. If the urine remains completely obstructed, it can cause loss of kidney function or even complete destruction of the involved kidney. It is your responsibility and in your interest that X-rays and follow-ups as suggested by your caregiver are completed. Relief of pain without passage of the stone can be associated with severe damage to the kidney, including loss of kidney function on that side. °· If your stone does not pass on its own, additional measures may be taken by your caregiver to ensure its removal. °HOME CARE INSTRUCTIONS  °· Increase your fluid intake. Water is the preferred fluid since juices containing vitamin C may acidify the urine making it  less likely for certain stones (uric acid stones) to pass. °· Strain all urine. A strainer will be provided. Keep all particulate matter or stones for your caregiver to inspect. °· Take your pain medicine as directed. °· Make a follow-up appointment with your caregiver as directed. °· Remember that the goal is passage of your stone. The absence of pain does not mean the stone is gone. Follow your caregiver's instructions. °· Only take over-the-counter or prescription medicines for pain, discomfort, or fever as directed by your caregiver. °SEEK MEDICAL CARE IF:  °· Pain cannot be controlled with the prescribed medicine. °· You have a fever. °· Pain continues for longer than your caregiver advises it should. °· There is a change in the pain, and you develop chest discomfort or constant abdominal pain. °· You feel faint or pass out. °MAKE SURE YOU:  °· Understand these instructions. °· Will watch your condition. °· Will get help right away if you are not doing well or get worse. °Document Released: 07/11/2005 Document Revised: 01/26/2013 Document Reviewed: 03/28/2011 °[employer]® Patient Information ©2015 [employer]. This information is not intended to replace advice given to you by your health care provider. Make sure you discuss any questions you have with your health care provider. ° °

## 2015-06-10 ENCOUNTER — Telehealth (HOSPITAL_BASED_OUTPATIENT_CLINIC_OR_DEPARTMENT_OTHER): Payer: Self-pay | Admitting: Emergency Medicine

## 2015-06-24 ENCOUNTER — Ambulatory Visit: Payer: Self-pay

## 2015-06-27 ENCOUNTER — Other Ambulatory Visit: Payer: Self-pay

## 2015-06-27 DIAGNOSIS — Z1231 Encounter for screening mammogram for malignant neoplasm of breast: Secondary | ICD-10-CM

## 2015-06-30 ENCOUNTER — Ambulatory Visit
Admission: RE | Admit: 2015-06-30 | Discharge: 2015-06-30 | Disposition: A | Discharge status: Home / Self Care | Payer: Medicare Other | Source: Ambulatory Visit

## 2015-06-30 DIAGNOSIS — Z1231 Encounter for screening mammogram for malignant neoplasm of breast: Secondary | ICD-10-CM

## 2015-08-09 DIAGNOSIS — Z79899 Other long term (current) drug therapy: Secondary | ICD-10-CM | POA: Diagnosis not present

## 2015-08-09 DIAGNOSIS — N200 Calculus of kidney: Secondary | ICD-10-CM | POA: Diagnosis not present

## 2015-08-09 DIAGNOSIS — K219 Gastro-esophageal reflux disease without esophagitis: Secondary | ICD-10-CM | POA: Diagnosis not present

## 2015-08-09 DIAGNOSIS — E785 Hyperlipidemia, unspecified: Secondary | ICD-10-CM | POA: Diagnosis not present

## 2015-08-09 DIAGNOSIS — B009 Herpesviral infection, unspecified: Secondary | ICD-10-CM | POA: Diagnosis not present

## 2015-08-09 DIAGNOSIS — G43009 Migraine without aura, not intractable, without status migrainosus: Secondary | ICD-10-CM | POA: Diagnosis not present

## 2015-08-09 DIAGNOSIS — M170 Bilateral primary osteoarthritis of knee: Secondary | ICD-10-CM | POA: Diagnosis not present

## 2015-08-09 DIAGNOSIS — E559 Vitamin D deficiency, unspecified: Secondary | ICD-10-CM | POA: Diagnosis not present

## 2015-08-09 DIAGNOSIS — M8580 Other specified disorders of bone density and structure, unspecified site: Secondary | ICD-10-CM | POA: Diagnosis not present

## 2015-08-09 DIAGNOSIS — Z1389 Encounter for screening for other disorder: Secondary | ICD-10-CM | POA: Diagnosis not present

## 2015-08-09 DIAGNOSIS — Z0001 Encounter for general adult medical examination with abnormal findings: Secondary | ICD-10-CM | POA: Diagnosis not present

## 2015-08-09 DIAGNOSIS — G4700 Insomnia, unspecified: Secondary | ICD-10-CM | POA: Diagnosis not present

## 2015-09-22 DIAGNOSIS — M8589 Other specified disorders of bone density and structure, multiple sites: Secondary | ICD-10-CM | POA: Diagnosis not present

## 2015-09-22 DIAGNOSIS — M85852 Other specified disorders of bone density and structure, left thigh: Secondary | ICD-10-CM | POA: Diagnosis not present

## 2015-10-16 HISTORY — PX: OTHER SURGICAL HISTORY: SHX169

## 2016-06-14 ENCOUNTER — Other Ambulatory Visit: Payer: Self-pay | Admitting: Pediatric Surgery

## 2016-06-14 DIAGNOSIS — Z1231 Encounter for screening mammogram for malignant neoplasm of breast: Secondary | ICD-10-CM

## 2016-06-20 DIAGNOSIS — M79642 Pain in left hand: Secondary | ICD-10-CM | POA: Diagnosis not present

## 2016-06-20 DIAGNOSIS — M65332 Trigger finger, left middle finger: Secondary | ICD-10-CM | POA: Diagnosis not present

## 2016-06-25 ENCOUNTER — Other Ambulatory Visit: Payer: Self-pay | Admitting: Orthopedic Surgery

## 2016-06-25 DIAGNOSIS — M659 Synovitis and tenosynovitis, unspecified: Secondary | ICD-10-CM | POA: Diagnosis not present

## 2016-06-25 DIAGNOSIS — M65332 Trigger finger, left middle finger: Secondary | ICD-10-CM | POA: Diagnosis not present

## 2016-07-02 ENCOUNTER — Ambulatory Visit: Payer: Medicare Other

## 2016-07-17 ENCOUNTER — Ambulatory Visit
Admission: RE | Admit: 2016-07-17 | Discharge: 2016-07-17 | Disposition: A | Discharge status: Home / Self Care | Payer: Medicare Other | Source: Ambulatory Visit | Attending: Pediatric Surgery | Admitting: Pediatric Surgery

## 2016-07-17 DIAGNOSIS — Z1231 Encounter for screening mammogram for malignant neoplasm of breast: Secondary | ICD-10-CM | POA: Diagnosis not present

## 2016-08-24 DIAGNOSIS — H52203 Unspecified astigmatism, bilateral: Secondary | ICD-10-CM | POA: Diagnosis not present

## 2016-08-24 DIAGNOSIS — H2513 Age-related nuclear cataract, bilateral: Secondary | ICD-10-CM | POA: Diagnosis not present

## 2016-08-29 DIAGNOSIS — Z23 Encounter for immunization: Secondary | ICD-10-CM | POA: Diagnosis not present

## 2016-10-30 DIAGNOSIS — Z1389 Encounter for screening for other disorder: Secondary | ICD-10-CM | POA: Diagnosis not present

## 2016-10-30 DIAGNOSIS — G4700 Insomnia, unspecified: Secondary | ICD-10-CM | POA: Diagnosis not present

## 2016-10-30 DIAGNOSIS — K219 Gastro-esophageal reflux disease without esophagitis: Secondary | ICD-10-CM | POA: Diagnosis not present

## 2016-10-30 DIAGNOSIS — B009 Herpesviral infection, unspecified: Secondary | ICD-10-CM | POA: Diagnosis not present

## 2016-10-30 DIAGNOSIS — E668 Other obesity: Secondary | ICD-10-CM | POA: Diagnosis not present

## 2016-10-30 DIAGNOSIS — M8580 Other specified disorders of bone density and structure, unspecified site: Secondary | ICD-10-CM | POA: Diagnosis not present

## 2016-10-30 DIAGNOSIS — Z79899 Other long term (current) drug therapy: Secondary | ICD-10-CM | POA: Diagnosis not present

## 2016-10-30 DIAGNOSIS — E559 Vitamin D deficiency, unspecified: Secondary | ICD-10-CM | POA: Diagnosis not present

## 2016-10-30 DIAGNOSIS — Z0000 Encounter for general adult medical examination without abnormal findings: Secondary | ICD-10-CM | POA: Diagnosis not present

## 2016-10-30 DIAGNOSIS — M170 Bilateral primary osteoarthritis of knee: Secondary | ICD-10-CM | POA: Diagnosis not present

## 2016-10-30 DIAGNOSIS — E785 Hyperlipidemia, unspecified: Secondary | ICD-10-CM | POA: Diagnosis not present

## 2016-10-30 DIAGNOSIS — L309 Dermatitis, unspecified: Secondary | ICD-10-CM | POA: Diagnosis not present

## 2016-10-30 DIAGNOSIS — G43009 Migraine without aura, not intractable, without status migrainosus: Secondary | ICD-10-CM | POA: Diagnosis not present

## 2017-02-23 ENCOUNTER — Ambulatory Visit (HOSPITAL_COMMUNITY)
Admission: EM | Admit: 2017-02-23 | Discharge: 2017-02-23 | Disposition: A | Discharge status: Home / Self Care | Payer: Medicare Other | Attending: Internal Medicine | Admitting: Internal Medicine

## 2017-02-23 ENCOUNTER — Encounter (HOSPITAL_COMMUNITY): Payer: Self-pay | Admitting: Emergency Medicine

## 2017-02-23 DIAGNOSIS — H109 Unspecified conjunctivitis: Secondary | ICD-10-CM

## 2017-02-23 MED ORDER — POLYMYXIN B-TRIMETHOPRIM 10000-0.1 UNIT/ML-% OP SOLN: 1.0000 [drp] | OPHTHALMIC | 0 refills | Status: AC

## 2017-02-23 NOTE — ED Provider Notes (Signed)
CSN: [id number]     Arrival date & time 02/23/17  1920 History   First MD Initiated Contact with Patient 02/23/17 2141     Chief Complaint  Patient presents with  . 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 Patient states that she started developing foreign body sensation in her right eye and thought she got an eyelash in her right eye, which then later progressed to eye redness, some minimal pain, green discharge, and some blurry vision. She wears glasses. She denies contact lenses use. Patient states the forign body sensation resolved.        Past Medical History:  Diagnosis Date  . Arthritis   . Cancer [facility]) 2006   right breast- over 30 radiation treatments  . GERD (gastroesophageal reflux disease)    occasional  . Headache(784.0)    in the past, none recent  . Heart murmur    slight   Past Surgical History:  Procedure Laterality Date  . BREAST BIOPSY Right 2006  . BREAST LUMPECTOMY Right 2006  . TOTAL HIP ARTHROPLASTY Right 02/02/2014   Procedure: RIGHT TOTAL HIP ARTHROPLASTY ANTERIOR APPROACH;  Surgeon: Mauri Pole, MD;  Location: [facility];  Service: Orthopedics;  Laterality: Right;   No family history on file. Social History  Substance Use Topics  . Smoking status: Never Smoker  . Smokeless tobacco: Never Used  . Alcohol use Yes     Comment: 2 glasses a week   OB History    No data available     Review of Systems  All other systems reviewed and are negative.   Allergies  Adhesive [tape]  Home Medications   Prior to Admission medications   Medication Sig Start Date End Date Taking? Authorizing Provider  celecoxib (CELEBREX) 200 MG capsule Take 200 mg by mouth daily.    [provider]  cholecalciferol (VITAMIN D) 1000 UNITS tablet Take 1,000 Units by mouth daily.     [provider]  CRANBERRY EXTRACT PO Take 1 capsule by mouth every morning.    [provider]  diphenhydrAMINE (BENADRYL) 50 MG capsule Take 50 mg by mouth at bedtime as needed for sleep.    [provider]  esomeprazole (NEXIUM) 20 MG capsule Take 20-40 mg by mouth daily as needed (acid reflux).     [provider]  ondansetron (ZOFRAN ODT) 8 MG disintegrating tablet Take 1 tablet (8 mg total) by mouth every 8 (eight) hours as needed for nausea or vomiting. 06/03/15   [name], MD  oxyCODONE-acetaminophen (PERCOCET/ROXICET) 5-325 MG per tablet Take 1 tablet by mouth every 4 (four) hours as needed for severe pain. 06/03/15   [name], MD  trimethoprim-polymyxin b (POLYTRIM) ophthalmic solution Place 1 drop into both eyes every 4 (four) hours. 02/23/17 03/02/17  Barry Dienes, NP  valACYclovir (VALTREX) 1000 MG tablet Take 500 mg by mouth daily. Takes 1/2 tablet    [provider]   Meds Ordered and Administered this Visit  Medications - No data to display  BP 128/76 (BP Location: Left Arm)   Pulse 70   Temp 98.3 F (36.8 C) (Oral)   Resp 14   SpO2 100%  No data found.   Physical Exam  Constitutional: She is oriented to person, place, and time. She appears well-developed and well-nourished.  HENT:  Left eye has mild redness. Eye discharge noted  at the inner canthus.   Eyes: EOM and lids are normal. Pupils are equal, round, and reactive to light. Lids are everted and swept, no foreign bodies found. Right eye exhibits discharge. No foreign body present in the right eye. No foreign body present in the left eye. Right conjunctiva is injected. Left conjunctiva is injected.  Cardiovascular: Normal rate, regular rhythm and normal heart sounds.   Pulmonary/Chest: Effort normal and breath sounds normal. She has no wheezes.  Neurological: She is alert and oriented to person, place, and time.  Nursing note and vitals reviewed.   Urgent Care  Course     Procedures (including critical care time)  Labs Review Labs Reviewed - No data to display  Imaging Review No results found.   Visual Acuity Review  Right Eye Distance: 20/40 Left Eye Distance: 20/25 Bilateral Distance: 20/20 (corrective lens)   MDM   1. Bacterial conjunctivitis of both eyes    Rx for Polytrim given. Reviewed directions for usage and side effects. Patient states understanding and will call with questions or problems. Patient instructed to call or follow up with his/her primary care doctor if failure to improve or change in symptoms. Discharge instruction given.     Barry Dienes, NP 02/23/17 2205

## 2017-02-23 NOTE — ED Triage Notes (Signed)
Onset today of feeling like something in right eye, now red, painful and draining

## 2017-03-08 DIAGNOSIS — H10413 Chronic giant papillary conjunctivitis, bilateral: Secondary | ICD-10-CM | POA: Diagnosis not present

## 2017-04-08 DIAGNOSIS — L821 Other seborrheic keratosis: Secondary | ICD-10-CM | POA: Diagnosis not present

## 2017-04-08 DIAGNOSIS — D1801 Hemangioma of skin and subcutaneous tissue: Secondary | ICD-10-CM | POA: Diagnosis not present

## 2017-04-08 DIAGNOSIS — L814 Other melanin hyperpigmentation: Secondary | ICD-10-CM | POA: Diagnosis not present

## 2017-04-08 DIAGNOSIS — L718 Other rosacea: Secondary | ICD-10-CM | POA: Diagnosis not present

## 2017-04-22 DIAGNOSIS — L237 Allergic contact dermatitis due to plants, except food: Secondary | ICD-10-CM | POA: Diagnosis not present

## 2017-07-08 ENCOUNTER — Other Ambulatory Visit: Payer: Self-pay | Admitting: Internal Medicine

## 2017-07-08 DIAGNOSIS — Z1231 Encounter for screening mammogram for malignant neoplasm of breast: Secondary | ICD-10-CM

## 2017-07-08 DIAGNOSIS — Z853 Personal history of malignant neoplasm of breast: Secondary | ICD-10-CM

## 2017-07-22 DIAGNOSIS — Z23 Encounter for immunization: Secondary | ICD-10-CM | POA: Diagnosis not present

## 2017-07-31 ENCOUNTER — Ambulatory Visit
Admission: RE | Admit: 2017-07-31 | Discharge: 2017-07-31 | Disposition: A | Discharge status: Home / Self Care | Payer: Medicare Other | Source: Ambulatory Visit | Attending: Internal Medicine | Admitting: Internal Medicine

## 2017-07-31 DIAGNOSIS — Z853 Personal history of malignant neoplasm of breast: Secondary | ICD-10-CM

## 2017-07-31 DIAGNOSIS — Z1231 Encounter for screening mammogram for malignant neoplasm of breast: Secondary | ICD-10-CM

## 2017-11-05 DIAGNOSIS — Z79899 Other long term (current) drug therapy: Secondary | ICD-10-CM | POA: Diagnosis not present

## 2017-11-05 DIAGNOSIS — Z0000 Encounter for general adult medical examination without abnormal findings: Secondary | ICD-10-CM | POA: Diagnosis not present

## 2017-11-05 DIAGNOSIS — G4700 Insomnia, unspecified: Secondary | ICD-10-CM | POA: Diagnosis not present

## 2017-11-05 DIAGNOSIS — L309 Dermatitis, unspecified: Secondary | ICD-10-CM | POA: Diagnosis not present

## 2017-11-05 DIAGNOSIS — Z1389 Encounter for screening for other disorder: Secondary | ICD-10-CM | POA: Diagnosis not present

## 2017-11-05 DIAGNOSIS — M7702 Medial epicondylitis, left elbow: Secondary | ICD-10-CM | POA: Diagnosis not present

## 2017-11-05 DIAGNOSIS — E559 Vitamin D deficiency, unspecified: Secondary | ICD-10-CM | POA: Diagnosis not present

## 2017-11-05 DIAGNOSIS — E785 Hyperlipidemia, unspecified: Secondary | ICD-10-CM | POA: Diagnosis not present

## 2017-11-05 DIAGNOSIS — G43009 Migraine without aura, not intractable, without status migrainosus: Secondary | ICD-10-CM | POA: Diagnosis not present

## 2017-11-05 DIAGNOSIS — M170 Bilateral primary osteoarthritis of knee: Secondary | ICD-10-CM | POA: Diagnosis not present

## 2017-11-05 DIAGNOSIS — E668 Other obesity: Secondary | ICD-10-CM | POA: Diagnosis not present

## 2017-11-05 DIAGNOSIS — K219 Gastro-esophageal reflux disease without esophagitis: Secondary | ICD-10-CM | POA: Diagnosis not present

## 2017-11-05 DIAGNOSIS — B009 Herpesviral infection, unspecified: Secondary | ICD-10-CM | POA: Diagnosis not present

## 2017-11-05 DIAGNOSIS — M8580 Other specified disorders of bone density and structure, unspecified site: Secondary | ICD-10-CM | POA: Diagnosis not present

## 2017-12-31 DIAGNOSIS — M8588 Other specified disorders of bone density and structure, other site: Secondary | ICD-10-CM | POA: Diagnosis not present

## 2018-01-13 DIAGNOSIS — L237 Allergic contact dermatitis due to plants, except food: Secondary | ICD-10-CM | POA: Diagnosis not present

## 2018-01-20 DIAGNOSIS — L247 Irritant contact dermatitis due to plants, except food: Secondary | ICD-10-CM | POA: Diagnosis not present

## 2018-01-20 DIAGNOSIS — Z79899 Other long term (current) drug therapy: Secondary | ICD-10-CM | POA: Diagnosis not present

## 2018-07-14 DIAGNOSIS — Z23 Encounter for immunization: Secondary | ICD-10-CM | POA: Diagnosis not present

## 2018-07-28 ENCOUNTER — Other Ambulatory Visit: Payer: Self-pay | Admitting: Internal Medicine

## 2018-07-28 DIAGNOSIS — Z1231 Encounter for screening mammogram for malignant neoplasm of breast: Secondary | ICD-10-CM

## 2018-09-02 ENCOUNTER — Other Ambulatory Visit: Payer: Self-pay | Admitting: Internal Medicine

## 2018-09-02 ENCOUNTER — Ambulatory Visit
Admission: RE | Admit: 2018-09-02 | Discharge: 2018-09-02 | Disposition: A | Discharge status: Home / Self Care | Payer: Medicare Other | Source: Ambulatory Visit | Attending: Internal Medicine | Admitting: Internal Medicine

## 2018-09-02 DIAGNOSIS — Z1231 Encounter for screening mammogram for malignant neoplasm of breast: Secondary | ICD-10-CM

## 2018-10-31 DIAGNOSIS — H52203 Unspecified astigmatism, bilateral: Secondary | ICD-10-CM | POA: Diagnosis not present

## 2018-10-31 DIAGNOSIS — H40052 Ocular hypertension, left eye: Secondary | ICD-10-CM | POA: Diagnosis not present

## 2018-10-31 DIAGNOSIS — H2513 Age-related nuclear cataract, bilateral: Secondary | ICD-10-CM | POA: Diagnosis not present

## 2018-10-31 DIAGNOSIS — H40012 Open angle with borderline findings, low risk, left eye: Secondary | ICD-10-CM | POA: Diagnosis not present

## 2018-11-11 DIAGNOSIS — M8580 Other specified disorders of bone density and structure, unspecified site: Secondary | ICD-10-CM | POA: Diagnosis not present

## 2018-11-11 DIAGNOSIS — B009 Herpesviral infection, unspecified: Secondary | ICD-10-CM | POA: Diagnosis not present

## 2018-11-11 DIAGNOSIS — G4700 Insomnia, unspecified: Secondary | ICD-10-CM | POA: Diagnosis not present

## 2018-11-11 DIAGNOSIS — Z1389 Encounter for screening for other disorder: Secondary | ICD-10-CM | POA: Diagnosis not present

## 2018-11-11 DIAGNOSIS — Z79899 Other long term (current) drug therapy: Secondary | ICD-10-CM | POA: Diagnosis not present

## 2018-11-11 DIAGNOSIS — E668 Other obesity: Secondary | ICD-10-CM | POA: Diagnosis not present

## 2018-11-11 DIAGNOSIS — Z0000 Encounter for general adult medical examination without abnormal findings: Secondary | ICD-10-CM | POA: Diagnosis not present

## 2018-11-11 DIAGNOSIS — E559 Vitamin D deficiency, unspecified: Secondary | ICD-10-CM | POA: Diagnosis not present

## 2018-11-11 DIAGNOSIS — K219 Gastro-esophageal reflux disease without esophagitis: Secondary | ICD-10-CM | POA: Diagnosis not present

## 2018-11-11 DIAGNOSIS — G43009 Migraine without aura, not intractable, without status migrainosus: Secondary | ICD-10-CM | POA: Diagnosis not present

## 2018-11-11 DIAGNOSIS — M170 Bilateral primary osteoarthritis of knee: Secondary | ICD-10-CM | POA: Diagnosis not present

## 2018-11-11 DIAGNOSIS — E785 Hyperlipidemia, unspecified: Secondary | ICD-10-CM | POA: Diagnosis not present

## 2018-11-17 DIAGNOSIS — M65312 Trigger thumb, left thumb: Secondary | ICD-10-CM | POA: Diagnosis not present

## 2019-06-29 DIAGNOSIS — Z853 Personal history of malignant neoplasm of breast: Secondary | ICD-10-CM | POA: Diagnosis not present

## 2019-06-29 DIAGNOSIS — N63 Unspecified lump in unspecified breast: Secondary | ICD-10-CM | POA: Diagnosis not present

## 2019-06-30 ENCOUNTER — Other Ambulatory Visit: Payer: Self-pay | Admitting: Internal Medicine

## 2019-06-30 DIAGNOSIS — N63 Unspecified lump in unspecified breast: Secondary | ICD-10-CM

## 2019-07-06 ENCOUNTER — Ambulatory Visit
Admission: RE | Admit: 2019-07-06 | Discharge: 2019-07-06 | Disposition: A | Discharge status: Home / Self Care | Payer: Medicare Other | Source: Ambulatory Visit | Attending: Internal Medicine | Admitting: Internal Medicine

## 2019-07-06 ENCOUNTER — Other Ambulatory Visit: Payer: Self-pay

## 2019-07-06 ENCOUNTER — Other Ambulatory Visit: Payer: Self-pay | Admitting: Internal Medicine

## 2019-07-06 DIAGNOSIS — N63 Unspecified lump in unspecified breast: Secondary | ICD-10-CM

## 2019-07-06 DIAGNOSIS — Z853 Personal history of malignant neoplasm of breast: Secondary | ICD-10-CM | POA: Diagnosis not present

## 2019-07-06 DIAGNOSIS — R928 Other abnormal and inconclusive findings on diagnostic imaging of breast: Secondary | ICD-10-CM | POA: Diagnosis not present

## 2019-07-06 DIAGNOSIS — N6310 Unspecified lump in the right breast, unspecified quadrant: Secondary | ICD-10-CM

## 2019-07-06 DIAGNOSIS — N6311 Unspecified lump in the right breast, upper outer quadrant: Secondary | ICD-10-CM | POA: Diagnosis not present

## 2019-07-06 DIAGNOSIS — R590 Localized enlarged lymph nodes: Secondary | ICD-10-CM | POA: Diagnosis not present

## 2019-07-07 ENCOUNTER — Ambulatory Visit
Admission: RE | Admit: 2019-07-07 | Discharge: 2019-07-07 | Disposition: A | Discharge status: Home / Self Care | Payer: Medicare Other | Source: Ambulatory Visit | Attending: Internal Medicine | Admitting: Internal Medicine

## 2019-07-07 ENCOUNTER — Other Ambulatory Visit: Payer: Self-pay | Admitting: Body Imaging

## 2019-07-07 DIAGNOSIS — N6311 Unspecified lump in the right breast, upper outer quadrant: Secondary | ICD-10-CM | POA: Diagnosis not present

## 2019-07-07 DIAGNOSIS — N6031 Fibrosclerosis of right breast: Secondary | ICD-10-CM | POA: Diagnosis not present

## 2019-07-07 DIAGNOSIS — N6310 Unspecified lump in the right breast, unspecified quadrant: Secondary | ICD-10-CM

## 2019-07-07 HISTORY — PX: BREAST BIOPSY: SHX20

## 2019-07-16 HISTORY — PX: BREAST EXCISIONAL BIOPSY: SUR124

## 2019-07-22 ENCOUNTER — Other Ambulatory Visit: Payer: Self-pay | Admitting: General Surgery

## 2019-07-22 DIAGNOSIS — N6310 Unspecified lump in the right breast, unspecified quadrant: Secondary | ICD-10-CM

## 2019-07-23 ENCOUNTER — Other Ambulatory Visit: Payer: Self-pay | Admitting: General Surgery

## 2019-07-23 DIAGNOSIS — N6310 Unspecified lump in the right breast, unspecified quadrant: Secondary | ICD-10-CM

## 2019-07-30 DIAGNOSIS — Z23 Encounter for immunization: Secondary | ICD-10-CM | POA: Diagnosis not present

## 2019-08-04 ENCOUNTER — Other Ambulatory Visit: Payer: Self-pay

## 2019-08-04 ENCOUNTER — Encounter (HOSPITAL_BASED_OUTPATIENT_CLINIC_OR_DEPARTMENT_OTHER): Payer: Self-pay | Admitting: *Deleted

## 2019-08-07 ENCOUNTER — Other Ambulatory Visit (HOSPITAL_COMMUNITY)
Admission: RE | Admit: 2019-08-07 | Discharge: 2019-08-07 | Disposition: A | Discharge status: Home / Self Care | Payer: Medicare Other | Source: Ambulatory Visit | Attending: General Surgery | Admitting: General Surgery

## 2019-08-07 DIAGNOSIS — Z01812 Encounter for preprocedural laboratory examination: Secondary | ICD-10-CM | POA: Insufficient documentation

## 2019-08-07 DIAGNOSIS — Z20828 Contact with and (suspected) exposure to other viral communicable diseases: Secondary | ICD-10-CM | POA: Insufficient documentation

## 2019-08-07 LAB — SARS CORONAVIRUS 2 (TAT 6-24 HRS): SARS Coronavirus 2: NEGATIVE

## 2019-08-07 NOTE — Progress Notes (Signed)

## 2019-08-10 ENCOUNTER — Other Ambulatory Visit: Payer: Self-pay

## 2019-08-10 ENCOUNTER — Ambulatory Visit
Admission: RE | Admit: 2019-08-10 | Discharge: 2019-08-10 | Disposition: A | Discharge status: Home / Self Care | Payer: Medicare Other | Source: Ambulatory Visit | Attending: General Surgery | Admitting: General Surgery

## 2019-08-10 DIAGNOSIS — N6489 Other specified disorders of breast: Secondary | ICD-10-CM | POA: Diagnosis not present

## 2019-08-10 DIAGNOSIS — N6310 Unspecified lump in the right breast, unspecified quadrant: Secondary | ICD-10-CM

## 2019-08-11 ENCOUNTER — Other Ambulatory Visit: Payer: Self-pay

## 2019-08-11 ENCOUNTER — Ambulatory Visit (HOSPITAL_BASED_OUTPATIENT_CLINIC_OR_DEPARTMENT_OTHER): Payer: Medicare Other | Admitting: Certified Registered"

## 2019-08-11 ENCOUNTER — Encounter (HOSPITAL_BASED_OUTPATIENT_CLINIC_OR_DEPARTMENT_OTHER)
Admission: RE | Disposition: A | Discharge status: Home / Self Care | Payer: Self-pay | Source: Home / Self Care | Attending: General Surgery

## 2019-08-11 ENCOUNTER — Encounter (HOSPITAL_BASED_OUTPATIENT_CLINIC_OR_DEPARTMENT_OTHER): Payer: Self-pay

## 2019-08-11 ENCOUNTER — Ambulatory Visit
Admission: RE | Admit: 2019-08-11 | Discharge: 2019-08-11 | Disposition: A | Discharge status: Home / Self Care | Payer: Medicare Other | Source: Ambulatory Visit | Attending: General Surgery | Admitting: General Surgery

## 2019-08-11 ENCOUNTER — Ambulatory Visit (HOSPITAL_BASED_OUTPATIENT_CLINIC_OR_DEPARTMENT_OTHER)
Admission: RE | Admit: 2019-08-11 | Discharge: 2019-08-11 | Disposition: A | Discharge status: Home / Self Care | Payer: Medicare Other | Attending: General Surgery | Admitting: General Surgery

## 2019-08-11 DIAGNOSIS — Z853 Personal history of malignant neoplasm of breast: Secondary | ICD-10-CM | POA: Diagnosis not present

## 2019-08-11 DIAGNOSIS — K219 Gastro-esophageal reflux disease without esophagitis: Secondary | ICD-10-CM | POA: Diagnosis not present

## 2019-08-11 DIAGNOSIS — N6310 Unspecified lump in the right breast, unspecified quadrant: Secondary | ICD-10-CM | POA: Diagnosis not present

## 2019-08-11 DIAGNOSIS — D500 Iron deficiency anemia secondary to blood loss (chronic): Secondary | ICD-10-CM | POA: Diagnosis not present

## 2019-08-11 DIAGNOSIS — Z888 Allergy status to other drugs, medicaments and biological substances status: Secondary | ICD-10-CM | POA: Insufficient documentation

## 2019-08-11 DIAGNOSIS — N6031 Fibrosclerosis of right breast: Secondary | ICD-10-CM | POA: Insufficient documentation

## 2019-08-11 DIAGNOSIS — G8918 Other acute postprocedural pain: Secondary | ICD-10-CM | POA: Diagnosis not present

## 2019-08-11 DIAGNOSIS — Z79899 Other long term (current) drug therapy: Secondary | ICD-10-CM | POA: Diagnosis not present

## 2019-08-11 DIAGNOSIS — Z923 Personal history of irradiation: Secondary | ICD-10-CM | POA: Insufficient documentation

## 2019-08-11 DIAGNOSIS — N641 Fat necrosis of breast: Secondary | ICD-10-CM | POA: Diagnosis not present

## 2019-08-11 DIAGNOSIS — M1990 Unspecified osteoarthritis, unspecified site: Secondary | ICD-10-CM | POA: Diagnosis not present

## 2019-08-11 DIAGNOSIS — Z791 Long term (current) use of non-steroidal anti-inflammatories (NSAID): Secondary | ICD-10-CM | POA: Diagnosis not present

## 2019-08-11 DIAGNOSIS — R928 Other abnormal and inconclusive findings on diagnostic imaging of breast: Secondary | ICD-10-CM | POA: Diagnosis not present

## 2019-08-11 HISTORY — PX: RADIOACTIVE SEED GUIDED EXCISIONAL BREAST BIOPSY: SHX6490

## 2019-08-11 SURGERY — RADIOACTIVE SEED GUIDED BREAST BIOPSY
Anesthesia: General | Site: Breast | Laterality: Right

## 2019-08-11 MED ORDER — ACETAMINOPHEN 500 MG PO TABS
1000.0000 mg | ORAL_TABLET | ORAL | Status: AC
  Administered 2019-08-11: 1000 mg via ORAL

## 2019-08-11 MED ORDER — ONDANSETRON HCL 4 MG/2ML IJ SOLN
INTRAMUSCULAR | Status: DC | PRN
  Administered 2019-08-11: 4 mg via INTRAVENOUS

## 2019-08-11 MED ORDER — PHENYLEPHRINE 40 MCG/ML (10ML) SYRINGE FOR IV PUSH (FOR BLOOD PRESSURE SUPPORT)
PREFILLED_SYRINGE | INTRAVENOUS | Status: AC
  Filled 2019-08-11: qty 20

## 2019-08-11 MED ORDER — CEFAZOLIN SODIUM-DEXTROSE 2-4 GM/100ML-% IV SOLN
2.0000 g | INTRAVENOUS | Status: AC
  Administered 2019-08-11: 2 g via INTRAVENOUS

## 2019-08-11 MED ORDER — BUPIVACAINE HCL (PF) 0.25 % IJ SOLN
INTRAMUSCULAR | Status: DC | PRN
  Administered 2019-08-11: 3 mL

## 2019-08-11 MED ORDER — HYDROMORPHONE HCL 1 MG/ML IJ SOLN: 0.2500 mg | INTRAMUSCULAR | Status: DC | PRN

## 2019-08-11 MED ORDER — FENTANYL CITRATE (PF) 100 MCG/2ML IJ SOLN
50.0000 ug | INTRAMUSCULAR | Status: DC | PRN
  Administered 2019-08-11: 50 ug via INTRAVENOUS

## 2019-08-11 MED ORDER — PROPOFOL 10 MG/ML IV BOLUS
INTRAVENOUS | Status: DC | PRN
  Administered 2019-08-11: 150 mg via INTRAVENOUS

## 2019-08-11 MED ORDER — LACTATED RINGERS IV SOLN
INTRAVENOUS | Status: DC
  Administered 2019-08-11: 12:00:00 via INTRAVENOUS

## 2019-08-11 MED ORDER — ENSURE PRE-SURGERY PO LIQD: 296.0000 mL | Freq: Once | ORAL | Status: DC

## 2019-08-11 MED ORDER — LIDOCAINE HCL (CARDIAC) PF 100 MG/5ML IV SOSY
PREFILLED_SYRINGE | INTRAVENOUS | Status: DC | PRN
  Administered 2019-08-11: 60 mg via INTRAVENOUS

## 2019-08-11 MED ORDER — FENTANYL CITRATE (PF) 100 MCG/2ML IJ SOLN
INTRAMUSCULAR | Status: AC
  Filled 2019-08-11: qty 2

## 2019-08-11 MED ORDER — EPHEDRINE 5 MG/ML INJ
INTRAVENOUS | Status: AC
  Filled 2019-08-11: qty 20

## 2019-08-11 MED ORDER — GABAPENTIN 100 MG PO CAPS
ORAL_CAPSULE | ORAL | Status: AC
  Filled 2019-08-11: qty 1

## 2019-08-11 MED ORDER — GABAPENTIN 100 MG PO CAPS
100.0000 mg | ORAL_CAPSULE | ORAL | Status: AC
  Administered 2019-08-11: 100 mg via ORAL

## 2019-08-11 MED ORDER — DEXAMETHASONE SODIUM PHOSPHATE 4 MG/ML IJ SOLN
INTRAMUSCULAR | Status: DC | PRN
  Administered 2019-08-11: 4 mg via INTRAVENOUS

## 2019-08-11 MED ORDER — ACETAMINOPHEN 500 MG PO TABS
ORAL_TABLET | ORAL | Status: AC
  Filled 2019-08-11: qty 2

## 2019-08-11 MED ORDER — CEFAZOLIN SODIUM-DEXTROSE 2-4 GM/100ML-% IV SOLN
INTRAVENOUS | Status: AC
  Filled 2019-08-11: qty 100

## 2019-08-11 SURGICAL SUPPLY — 47 items
ADH SKN CLS APL DERMABOND .7 (GAUZE/BANDAGES/DRESSINGS) ×1
APL PRP STRL LF DISP 70% ISPRP (MISCELLANEOUS) ×1
BINDER BREAST LRG (GAUZE/BANDAGES/DRESSINGS) ×2 IMPLANT
BINDER BREAST XLRG (GAUZE/BANDAGES/DRESSINGS) ×2 IMPLANT
BLADE SURG 15 STRL LF DISP TIS (BLADE) ×1 IMPLANT
BLADE SURG 15 STRL SS (BLADE) ×3
CHLORAPREP W/TINT 26 (MISCELLANEOUS) ×3 IMPLANT
CLIP VESOCCLUDE SM WIDE 6/CT (CLIP) IMPLANT
CLOSURE WOUND 1/2 X4 (GAUZE/BANDAGES/DRESSINGS) ×1
COVER BACK TABLE REUSABLE LG (DRAPES) ×3 IMPLANT
COVER MAYO STAND REUSABLE (DRAPES) ×3 IMPLANT
COVER PROBE W GEL 5X96 (DRAPES) ×3 IMPLANT
DERMABOND ADVANCED (GAUZE/BANDAGES/DRESSINGS) ×2
DERMABOND ADVANCED .7 DNX12 (GAUZE/BANDAGES/DRESSINGS) ×1 IMPLANT
DRAPE LAPAROSCOPIC ABDOMINAL (DRAPES) ×3 IMPLANT
DRAPE UTILITY XL STRL (DRAPES) ×3 IMPLANT
DRSG TEGADERM 4X4.75 (GAUZE/BANDAGES/DRESSINGS) IMPLANT
ELECT COATED BLADE 2.86 ST (ELECTRODE) ×3 IMPLANT
ELECT REM PT RETURN 9FT ADLT (ELECTROSURGICAL) ×3
ELECTRODE REM PT RTRN 9FT ADLT (ELECTROSURGICAL) ×1 IMPLANT
GAUZE SPONGE 4X4 12PLY STRL LF (GAUZE/BANDAGES/DRESSINGS) IMPLANT
GLOVE BIO SURGEON STRL SZ 6.5 (GLOVE) ×1 IMPLANT
GLOVE BIO SURGEON STRL SZ7 (GLOVE) ×6 IMPLANT
GLOVE BIO SURGEONS STRL SZ 6.5 (GLOVE) ×1
GLOVE BIOGEL PI IND STRL 7.0 (GLOVE) IMPLANT
GLOVE BIOGEL PI IND STRL 7.5 (GLOVE) ×1 IMPLANT
GLOVE BIOGEL PI INDICATOR 7.0 (GLOVE) ×4
GLOVE BIOGEL PI INDICATOR 7.5 (GLOVE) ×2
GOWN STRL REUS W/ TWL LRG LVL3 (GOWN DISPOSABLE) ×2 IMPLANT
GOWN STRL REUS W/TWL LRG LVL3 (GOWN DISPOSABLE) ×6
KIT MARKER MARGIN INK (KITS) ×3 IMPLANT
NDL HYPO 25X1 1.5 SAFETY (NEEDLE) ×1 IMPLANT
NEEDLE HYPO 25X1 1.5 SAFETY (NEEDLE) ×3 IMPLANT
NS IRRIG 1000ML POUR BTL (IV SOLUTION) ×2 IMPLANT
PACK BASIN DAY SURGERY FS (CUSTOM PROCEDURE TRAY) ×3 IMPLANT
PENCIL BUTTON HOLSTER BLD 10FT (ELECTRODE) ×2 IMPLANT
SLEEVE SCD COMPRESS KNEE MED (MISCELLANEOUS) ×3 IMPLANT
SPONGE LAP 4X18 RFD (DISPOSABLE) ×3 IMPLANT
STRIP CLOSURE SKIN 1/2X4 (GAUZE/BANDAGES/DRESSINGS) ×2 IMPLANT
SUT MNCRL AB 4-0 PS2 18 (SUTURE) ×2 IMPLANT
SUT VIC AB 2-0 SH 27 (SUTURE) ×3
SUT VIC AB 2-0 SH 27XBRD (SUTURE) ×1 IMPLANT
SUT VIC AB 3-0 SH 27 (SUTURE) ×3
SUT VIC AB 3-0 SH 27X BRD (SUTURE) ×1 IMPLANT
SYR CONTROL 10ML LL (SYRINGE) ×3 IMPLANT
TOWEL GREEN STERILE FF (TOWEL DISPOSABLE) ×3 IMPLANT
TRAY FAXITRON CT DISP (TRAY / TRAY PROCEDURE) ×3 IMPLANT

## 2019-08-11 NOTE — Op Note (Signed)
Preoperative diagnosis:Right breast mass with discordant biopsy Postoperative diagnosis: same as above Procedure: Right breast seed guided excisional biopsy Surgeon: Dr Serita Grammes Anesthesia: general EBL: minimal Specimens: Right breast tissue marked with paint Complications none Drains none Sponge and needle count correct dispo to recovery stable  Indications:73 yof with history in 2006 of right breast cancer treated with lump/sn/radiotherapy. she noted a right breast mass near her scar in september. she has no dc. she is otherwise healthy. she underwent mm and [place] that shows a 1 cm mass that extends to skin surface. nodes are negative. this underwent a core biopsy with clip placement that shows fibrosis with fat necrosis, excision recommended. we discussed seed guided excision.   Procedure: After informed consent obtained patient was taken to the OR. She was given antibiotics and SCDs were in place. She was placed under general anesthesia without complication. She was prepped and draped in the standard sterile surgical fashion. Timeout was performed.  Ilocated the seedand infiltrated marcaine. I made an elliptical incision overlying the seed. I then used the neoprobe to remove the seed and the surrounding tissue. Mammogram confirmed removal of seed and clip.   Hemostasis was observedThe tissue was closed with 2-0 vicryl. The skin was closed with 3-0 vicryl and4-0 monocryl. Glue and steristrips were applied

## 2019-08-11 NOTE — Anesthesia Procedure Notes (Signed)
Procedure Name: LMA Insertion Date/Time: 08/11/2019 1:51 PM Performed by: Signe Colt, CRNA Pre-anesthesia Checklist: Patient identified, Emergency Drugs available, Suction available and Patient being monitored Patient Re-evaluated:Patient Re-evaluated prior to induction Oxygen Delivery Method: Circle system utilized Preoxygenation: Pre-oxygenation with 100% oxygen Induction Type: IV induction Ventilation: Mask ventilation without difficulty LMA: LMA inserted LMA Size: 4.0 Number of attempts: 1 Airway Equipment and Method: Bite block Placement Confirmation: positive ETCO2 Tube secured with: Tape Dental Injury: Teeth and Oropharynx as per pre-operative assessment

## 2019-08-11 NOTE — H&P (Signed)
73 yof referred by Dr Theda Sers for right breast mass. she has history in 2006 of right breast cancer treated with lump/sn/radiotherapy. she took three years of antiestrogens and stopped due to intolerance and endometrial hyperplasia. she has done well since then. no issues. she noted a right breast mass near her scar in september. she has no dc. she is otherwise healthy. she underwent mm and [place] that shows a 1 cm mass that extends to skin surface. nodes are negative. this underwent a core biopsy with clip placement that shows fibrosis with fat necrosis, excision recommended. she is here to discuss that today   Past Surgical History Nance Pew, CMA; 73 yof referred by Dr Theda Sers for right breast mass. she has history in 2006 of right breast cancer treated with lump/sn/radiotherapy. she took three years of antiestrogens and stopped due to intolerance and endometrial hyperplasia. she has done well since then. no issues. she noted a right breast mass near her scar in september. she has no dc. she is otherwise healthy. she underwent mm and [place] that shows a 1 cm mass that extends to skin surface. nodes are negative. this underwent a core biopsy with clip placement that shows fibrosis with fat necrosis, excision recommended. she is here to discuss that today   Past Surgical History Nance Pew, CMA; 07/22/2019 8:59 AM) Breast Biopsy  Right. multiple Breast Mass; Local Excision  Right. Hip Surgery  Right. Oral Surgery  Sentinel Lymph Node Biopsy   Diagnostic Studies History Nance Pew, CMA; 07/22/2019 8:59 AM) Colonoscopy  5-10 years ago Mammogram  within last year Pap Smear  >5 years ago  Allergies Nance Pew, CMA; 07/22/2019 9:01 AM) Adhesive Tape 1"x5yd *MEDICAL DEVICES AND SUPPLIES*  Allergies Reconciled   Medication History (Sabrina Canty, CMA; 07/22/2019 9:02 AM) valACYclovir HCl (1GM Tablet, Oral) Active. Triamcinolone Acetonide (0.025% Cream, External) Active. Celecoxib (200MG  Capsule, Oral) Active. NexIUM (40MG  Packet, Oral) Active. Vital-D Rx (1MG  Tablet, Oral) Active. Medications Reconciled  Social History Nance Pew, CMA; 07/22/2019 8:59 AM) Alcohol use  Occasional alcohol use. Caffeine use  Coffee. No drug use  Tobacco use  Never smoker.  Family History Nance Pew, [place]; 07/22/2019 8:59 AM) Arthritis  Brother, Mother. Breast Cancer  Mother. Melanoma  Father, Mother.  Pregnancy / Birth History Nance Pew, [place]; 07/22/2019 8:59 AM) Age at menarche  73 years. Age of menopause  [age] Contraceptive History  Oral contraceptives. Gravida  2 Irregular periods  Maternal age   40-30 Para  2  Other Problems Nance Pew, CMA; 07/22/2019 8:59 AM) Arthritis  Breast Cancer  Gastroesophageal Reflux Disease  Heart murmur  Kidney Stone  Lump In Breast     Review of Systems Gabriel Cirri Canty CMA; 07/22/2019 8:59 AM) General Not Present- Appetite Loss, Chills, Fatigue, Fever, Night Sweats, Weight Gain and Weight Loss. Skin Not Present- Change in Wart/Mole, Dryness, Hives, Jaundice, New Lesions, Non-Healing Wounds, Rash and Ulcer. HEENT Present- Wears glasses/contact lenses. Not Present- Earache, Hearing Loss, Hoarseness, Nose Bleed, Oral Ulcers, Ringing in the Ears, Seasonal Allergies, Sinus Pain, Sore Throat, Visual Disturbances and Yellow Eyes. Respiratory Present- Snoring. Not Present- Bloody sputum, Chronic Cough, Difficulty Breathing and Wheezing. Breast Present- Breast Mass. Not Present- Breast Pain, Nipple Discharge and Skin Changes. Cardiovascular Not Present- Chest Pain, Difficulty Breathing Lying Down, Leg Cramps, Palpitations, Rapid Heart Rate, Shortness of Breath and Swelling of Extremities. Gastrointestinal Not Present- Abdominal Pain, Bloating, Bloody Stool, Change in Bowel Habits, Chronic diarrhea, Constipation, Difficulty Swallowing, Excessive gas, Gets full quickly at meals, Hemorrhoids, Indigestion, Nausea, Rectal Pain and Vomiting. Female Genitourinary Not Present- Frequency, Nocturia, Painful Urination, Pelvic Pain and Urgency. Musculoskeletal Present- Joint Pain. Not Present- Back Pain, Joint Stiffness, Muscle Pain, Muscle Weakness and Swelling of Extremities. Neurological Not Present- Decreased Memory, Fainting, Headaches, Numbness, Seizures, Tingling, Tremor, Trouble walking and Weakness. Psychiatric Not Present- Anxiety, Bipolar, Change in Sleep Pattern, Depression, Fearful and Frequent crying. Endocrine Not Present- Cold Intolerance, Excessive Hunger, Hair Changes, Heat Intolerance, Hot flashes and New Diabetes. Hematology Not Present- Blood  Thinners, Easy Bruising, Excessive bleeding, Gland problems, HIV and Persistent Infections.  Vitals (Sabrina Canty CMA; 07/22/2019 9:03 AM) 07/22/2019 9:02 AM Weight: 186.6 lb Height: 68in  Body Surface Area: 1.98 m Body Mass Index: 28.37 kg/m  Temp.: 97.68F(Temporal)  Pulse: 121 (Regular)  BP: 118/72 (Sitting, Left Arm, Standard)       Physical Exam Rolm Bookbinder MD; 07/22/2019 9:32 AM) General Mental Status-Alert. Orientation-Oriented X3.  Head and Neck Trachea-midline.  Eye Sclera/Conjunctiva - Bilateral-No scleral icterus.  Chest and Lung Exam Chest and lung exam reveals -quiet, even and easy respiratory effort with no use of accessory muscles.  Breast Nipples-No Discharge. Note: 1 cm hard area inferior to the scar    Lymphatic Head & Neck  General Head & Neck Lymphatics: Bilateral - Description - Normal. Axillary  General Axillary Region: Bilateral - Description - Normal. Note: no [place] adenopathy     Assessment & Plan Rolm Bookbinder MD; 07/22/2019 9:33 AM) BREAST MASS, RIGHT (N63.10) Story: right breast seed guided excisional biopsy this area is somewhat vague after biopsy so will do with seed guidance if possible. it is benign on core but suspicious on exam and I think needs excision given history. we discussed surgery ,risks and recovery today.   Past Surgical History Nance Pew, CMA; 07/22/2019 8:59 AM) Breast Biopsy  Right. multiple Breast Mass; Local Excision  Right. Hip Surgery  Right. Oral Surgery  Sentinel Lymph Node Biopsy   Diagnostic Studies History Nance Pew, CMA; 07/22/2019 8:59 AM) Colonoscopy  5-10 years ago Mammogram  within last year Pap Smear  >5 years ago  Allergies Nance Pew, CMA; 07/22/2019 9:01 AM) Adhesive Tape 1"x5yd *MEDICAL DEVICES AND SUPPLIES*  Allergies Reconciled   Medication History (Sabrina Canty, CMA; 07/22/2019 9:02 AM) valACYclovir HCl (1GM Tablet, Oral) Active. Triamcinolone Acetonide (0.025% Cream, External) Active. Celecoxib (200MG  Capsule, Oral) Active. NexIUM (40MG  Packet, Oral) Active. Vital-D Rx (1MG  Tablet, Oral) Active. Medications Reconciled  Social History Nance Pew, CMA; 07/22/2019 8:59 AM) Alcohol use  Occasional alcohol use. Caffeine use  Coffee. No drug use  Tobacco use  Never smoker.  Family History Nance Pew, [place]; 07/22/2019 8:59 AM) Arthritis  Brother, Mother. Breast Cancer  Mother. Melanoma  Father, Mother.  Pregnancy / Birth History Nance Pew, [place]; 07/22/2019 8:59 AM) Age at menarche  73 years. Age of menopause  [age] Contraceptive History  Oral contraceptives. Gravida  2 Irregular periods  Maternal age   40-30 Para  2  Other Problems Nance Pew, CMA; 07/22/2019 8:59 AM) Arthritis  Breast Cancer  Gastroesophageal Reflux Disease  Heart murmur  Kidney Stone  Lump In Breast     Review of Systems Gabriel Cirri Canty CMA; 07/22/2019 8:59 AM) General Not Present- Appetite Loss, Chills, Fatigue, Fever, Night Sweats, Weight Gain and Weight Loss. Skin Not Present- Change in Wart/Mole, Dryness, Hives, Jaundice, New Lesions, Non-Healing Wounds, Rash and Ulcer. HEENT Present- Wears glasses/contact lenses. Not Present- Earache, Hearing Loss, Hoarseness, Nose Bleed, Oral Ulcers, Ringing in the Ears, Seasonal Allergies, Sinus Pain, Sore Throat, Visual Disturbances and Yellow Eyes. Respiratory Present- Snoring. Not Present- Bloody sputum, Chronic Cough, Difficulty Breathing and Wheezing. Breast Present- Breast Mass. Not Present- Breast Pain, Nipple Discharge and Skin Changes. Cardiovascular Not Present- Chest Pain, Difficulty Breathing Lying Down, Leg Cramps, Palpitations, Rapid Heart Rate, Shortness of Breath and Swelling of Extremities. Gastrointestinal Not Present- Abdominal Pain, Bloating, Bloody Stool, Change in Bowel Habits, Chronic diarrhea, Constipation, Difficulty Swallowing, Excessive gas, Gets full quickly at meals, Hemorrhoids, Indigestion, Nausea, Rectal Pain and Vomiting. Female Genitourinary Not Present- Frequency, Nocturia, Painful Urination, Pelvic Pain and Urgency. Musculoskeletal Present- Joint Pain. Not Present- Back Pain, Joint Stiffness, Muscle Pain, Muscle Weakness and Swelling of Extremities. Neurological Not Present- Decreased Memory, Fainting, Headaches, Numbness, Seizures, Tingling, Tremor, Trouble walking and Weakness. Psychiatric Not Present- Anxiety, Bipolar, Change in Sleep Pattern, Depression, Fearful and Frequent crying. Endocrine Not Present- Cold Intolerance, Excessive Hunger, Hair Changes, Heat Intolerance, Hot flashes and New Diabetes. Hematology Not Present- Blood  Thinners, Easy Bruising, Excessive bleeding, Gland problems, HIV and Persistent Infections.  Vitals (Sabrina Canty CMA; 07/22/2019 9:03 AM) 07/22/2019 9:02 AM Weight: 186.6 lb Height: 68in  Body Surface Area: 1.98 m Body Mass Index: 28.37 kg/m  Temp.: 97.68F(Temporal)  Pulse: 121 (Regular)  BP: 118/72 (Sitting, Left Arm, Standard)       Physical Exam Rolm Bookbinder MD; 07/22/2019 9:32 AM) General Mental Status-Alert. Orientation-Oriented X3.  Head and Neck Trachea-midline.  Eye Sclera/Conjunctiva - Bilateral-No scleral icterus.  Chest and Lung Exam Chest and lung exam reveals -quiet, even and easy respiratory effort with no use of accessory muscles.  Breast Nipples-No Discharge. Note: 1 cm hard area inferior to the scar    Lymphatic Head & Neck  General Head & Neck Lymphatics: Bilateral - Description - Normal. Axillary  General Axillary Region: Bilateral - Description - Normal. Note: no [place] adenopathy     Assessment & Plan Rolm Bookbinder MD; 07/22/2019 9:33 AM) BREAST MASS, RIGHT (N63.10) Story: right breast seed guided excisional biopsy this area is somewhat vague after biopsy so will do with seed guidance if possible. it is benign on core but suspicious on exam and I think needs excision given history. we discussed surgery ,risks and recovery today.  Right. multiple Breast Mass; Local Excision  Right. Hip Surgery  Right. Oral Surgery  Sentinel Lymph Node Biopsy   Diagnostic Studies History Nance Pew, CMA; 07/22/2019 8:59 AM) Colonoscopy  5-10 years ago Mammogram  within last year Pap Smear  >5 years ago  Allergies Nance Pew, CMA; 07/22/2019 9:01 AM) Adhesive Tape 1"x5yd *MEDICAL DEVICES AND SUPPLIES*  Allergies Reconciled   Medication History (Sabrina Canty, CMA; 07/22/2019 9:02 AM) valACYclovir HCl (1GM Tablet, Oral) Active. Triamcinolone Acetonide (0.025% Cream, External) Active. Celecoxib (200MG  Capsule, Oral) Active. NexIUM (40MG  Packet, Oral) Active. Vital-D Rx (1MG  Tablet, Oral) Active. Medications Reconciled  Social History Nance Pew, CMA; 07/22/2019 8:59 AM) Alcohol use  Occasional alcohol use. Caffeine use  Coffee. No drug use  Tobacco use  Never smoker.  Family History Nance Pew, [place]; 07/22/2019 8:59 AM) Arthritis  Brother, Mother. Breast Cancer  Mother. Melanoma  Father, Mother.  Pregnancy / Birth History Nance Pew, [place]; 07/22/2019 8:59 AM) Age at menarche  73 years. Age of menopause  [age] Contraceptive History  Oral contraceptives. Gravida  2 Irregular periods  Maternal age   40-30 Para  2  Other Problems Nance Pew, CMA; 07/22/2019 8:59 AM) Arthritis  Breast Cancer  Gastroesophageal Reflux Disease  Heart murmur  Kidney Stone  Lump In Breast     Review of Systems Gabriel Cirri Canty CMA; 07/22/2019 8:59 AM) General Not Present- Appetite Loss, Chills, Fatigue, Fever, Night Sweats, Weight Gain and Weight Loss. Skin Not Present- Change in Wart/Mole, Dryness, Hives, Jaundice, New Lesions, Non-Healing Wounds, Rash and Ulcer. HEENT Present- Wears glasses/contact lenses. Not Present- Earache, Hearing Loss, Hoarseness, Nose Bleed, Oral Ulcers, Ringing in the Ears, Seasonal Allergies, Sinus Pain, Sore Throat, Visual Disturbances and Yellow Eyes. Respiratory Present- Snoring. Not Present- Bloody sputum, Chronic Cough, Difficulty Breathing and Wheezing. Breast Present- Breast Mass. Not Present- Breast Pain, Nipple Discharge and Skin Changes. Cardiovascular Not Present- Chest Pain, Difficulty Breathing Lying Down, Leg Cramps, Palpitations, Rapid Heart Rate, Shortness of Breath and Swelling of Extremities. Gastrointestinal Not Present- Abdominal Pain, Bloating, Bloody Stool, Change in Bowel Habits, Chronic diarrhea, Constipation, Difficulty Swallowing, Excessive gas, Gets full quickly at meals, Hemorrhoids, Indigestion, Nausea, Rectal Pain and Vomiting. Female Genitourinary Not Present- Frequency, Nocturia, Painful Urination, Pelvic Pain and Urgency. Musculoskeletal Present- Joint Pain. Not Present- Back Pain, Joint Stiffness, Muscle Pain, Muscle Weakness and Swelling of Extremities. Neurological Not Present- Decreased Memory, Fainting, Headaches, Numbness, Seizures, Tingling, Tremor, Trouble walking and Weakness. Psychiatric Not Present- Anxiety, Bipolar, Change in Sleep Pattern, Depression, Fearful and Frequent crying. Endocrine Not Present- Cold Intolerance, Excessive Hunger, Hair Changes, Heat Intolerance, Hot flashes and New Diabetes. Hematology Not Present- Blood  Thinners, Easy Bruising, Excessive bleeding, Gland problems, HIV and Persistent Infections.  Vitals (Sabrina Canty CMA; 07/22/2019 9:03 AM) 07/22/2019 9:02 AM Weight: 186.6 lb Height: 68in  Body Surface Area: 1.98 m Body Mass Index: 28.37 kg/m  Temp.: 97.68F(Temporal)  Pulse: 121 (Regular)  BP: 118/72 (Sitting, Left Arm, Standard)       Physical Exam Rolm Bookbinder MD; 07/22/2019 9:32 AM) General Mental Status-Alert. Orientation-Oriented X3.  Head and Neck Trachea-midline.  Eye Sclera/Conjunctiva - Bilateral-No scleral icterus.  Chest and Lung Exam Chest and lung exam reveals -quiet, even and easy respiratory effort with no use of accessory muscles.  Breast Nipples-No Discharge. Note: 1 cm hard area inferior to the scar    Lymphatic Head & Neck  General Head & Neck Lymphatics: Bilateral - Description - Normal. Axillary  General Axillary Region: Bilateral - Description - Normal. Note: no [place] adenopathy     Assessment & Plan Rolm Bookbinder MD; 07/22/2019 9:33 AM) BREAST MASS, RIGHT (N63.10) Story: right breast seed guided excisional biopsy this area is somewhat vague after biopsy so will do with seed guidance if possible. it is benign on core but suspicious on exam and I think needs excision given history. we discussed surgery ,risks and recovery today.

## 2019-08-11 NOTE — Discharge Instructions (Signed)
[place] Office Phone Number [phone]  BREAST BIOPSY/ PARTIAL MASTECTOMY: POST OP INSTRUCTIONS Take 400 mg of ibuprofen every 8 hours or 650 mg tylenol every 6 hours for next 72 hours then as needed. Use ice several times daily also. Always review your discharge instruction sheet given to you by the facility where your surgery was performed.  IF YOU HAVE DISABILITY OR FAMILY LEAVE FORMS, YOU MUST BRING THEM TO THE OFFICE FOR PROCESSING.  DO NOT GIVE THEM TO YOUR DOCTOR.  1. A prescription for pain medication may be given to you upon discharge.  Take your pain medication as prescribed, if needed.  If narcotic pain medicine is not needed, then you may take acetaminophen (Tylenol), naprosyn (Alleve) or ibuprofen (Advil) as needed. 2. Take your usually prescribed medications unless otherwise directed 3. If you need a refill on your pain medication, please contact your pharmacy.  They will contact our office to request authorization.  Prescriptions will not be filled after 5pm or on week-ends. 4. You should eat very light the first 24 hours after surgery, such as soup, crackers, pudding, etc.  Resume your normal diet the day after surgery. 5. Most patients will experience some swelling and bruising in the breast.  Ice packs and a good support bra will help.  Wear the breast binder provided or a sports bra for 72 hours day and night.  After that wear a sports bra during the day until you return to the office. Swelling and bruising can take several days to resolve.  6. It is common to experience some constipation if taking pain medication after surgery.  Increasing fluid intake and taking a stool softener will usually help or prevent this problem from occurring.  A mild laxative (Milk of Magnesia or Miralax) should be taken according to package directions if there are no bowel movements after 48 hours. 7. Unless discharge instructions indicate otherwise, you may remove your bandages 48  hours after surgery and you may shower at that time.  You may have steri-strips (small skin tapes) in place directly over the incision.  These strips should be left on the skin for 7-10 days and will come off on their own.  If your surgeon used skin glue on the incision, you may shower in 24 hours.  The glue will flake off over the next 2-3 weeks.  Any sutures or staples will be removed at the office during your follow-up visit. 8. ACTIVITIES:  You may resume regular daily activities (gradually increasing) beginning the next day.  Wearing a good support bra or sports bra minimizes pain and swelling.  You may have sexual intercourse when it is comfortable. a. You may drive when you no longer are taking prescription pain medication, you can comfortably wear a seatbelt, and you can safely maneuver your car and apply brakes. b. RETURN TO WORK:  ______________________________________________________________________________________ 9. You should see your doctor in the office for a follow-up appointment approximately two weeks after your surgery.  Your doctors nurse will typically make your follow-up appointment when she calls you with your pathology report.  Expect your pathology report 3-4 business days after your surgery.  You may call to check if you do not hear from [place] after three days. 10. OTHER INSTRUCTIONS: _______________________________________________________________________________________________ _____________________________________________________________________________________________________________________________________ _____________________________________________________________________________________________________________________________________ _____________________________________________________________________________________________________________________________________  WHEN TO CALL DR WAKEFIELD: 1. Fever over 101.0 2. Nausea and/or vomiting. 3. Extreme swelling or  bruising. 4. Continued bleeding from incision. 5. Increased pain, redness, or drainage from the incision.  The clinic  staff is available to answer your questions during regular business hours.  Please dont hesitate to call and ask to speak to one of the nurses for clinical concerns.  If you have a medical emergency, go to the nearest emergency room or call 911.  A surgeon from [facility] is always on call at the hospital.  For further questions, please visit [email or web address]   No Tylenol before 7:30pm  Post Anesthesia Home Care Instructions  Activity: Get plenty of rest for the remainder of the day. A responsible individual must stay with you for 24 hours following the procedure.  For the next 24 hours, DO NOT: -Drive a car -Paediatric nurse -Drink alcoholic beverages -Take any medication unless instructed by your physician -Make any legal decisions or sign important papers.  Meals: Start with liquid foods such as gelatin or soup. Progress to regular foods as tolerated. Avoid greasy, spicy, heavy foods. If nausea and/or vomiting occur, drink only clear liquids until the nausea and/or vomiting subsides. Call your physician if vomiting continues.  Special Instructions/Symptoms: Your throat may feel dry or sore from the anesthesia or the breathing tube placed in your throat during surgery. If this causes discomfort, gargle with warm salt water. The discomfort should disappear within 24 hours.  If you had a scopolamine patch placed behind your ear for the management of post- operative nausea and/or vomiting:  1. The medication in the patch is effective for 72 hours, after which it should be removed.  Wrap patch in a tissue and discard in the trash. Wash hands thoroughly with soap and water. 2. You may remove the patch earlier than 72 hours if you experience unpleasant side effects which may include dry mouth, dizziness or visual disturbances. 3. Avoid  touching the patch. Wash your hands with soap and water after contact with the patch.

## 2019-08-11 NOTE — Anesthesia Procedure Notes (Signed)
Anesthesia Regional Block: Pectoralis block   Pre-Anesthetic Checklist: ,, timeout performed, Correct Patient, Correct Site, Correct Laterality, Correct Procedure, Correct Position, site marked, Risks and benefits discussed,  Surgical consent,  Pre-op evaluation,  At surgeon's request and post-op pain management  Laterality: Right  Prep: chloraprep       Needles:   Needle Type: Echogenic Stimulator Needle          Additional Needles:   Procedures: Doppler guided,,,, ultrasound used (permanent image in chart),,,,  Narrative:  Start time: 08/11/2019 10:45 AM End time: 08/11/2019 11:00 AM Injection made incrementally with aspirations every 5 mL.  Performed by: Personally  Anesthesiologist: Belinda Block, MD

## 2019-08-11 NOTE — Anesthesia Postprocedure Evaluation (Signed)
Anesthesia Post Note  Patient: Tonya Benson  Procedure(s) Performed: RADIOACTIVE SEED GUIDED RIGHT BREAST EXCISIONAL BIOPSY (Right Breast)     Patient location during evaluation: PACU Anesthesia Type: General Level of consciousness: awake Pain management: pain level controlled Vital Signs Assessment: post-procedure vital signs reviewed and stable Respiratory status: spontaneous breathing Cardiovascular status: stable Postop Assessment: no apparent nausea or vomiting Anesthetic complications: no    Last Vitals:  Vitals:   08/11/19 1430 08/11/19 1445  BP: 107/80 94/71  Pulse: 67 65  Resp: 14 15  Temp:    SpO2: 100% 100%    Last Pain:  Vitals:   08/11/19 1445  TempSrc:   PainSc: 0-No pain                 Ronald Londo

## 2019-08-11 NOTE — Anesthesia Preprocedure Evaluation (Signed)
Anesthesia Evaluation  Patient identified by MRN, date of birth, ID band Patient awake    Reviewed: Allergy & Precautions, NPO status , Patient's Chart, lab work & pertinent test results  Airway Mallampati: II  TM Distance: >3 FB     Dental   Pulmonary    breath sounds clear to auscultation       Cardiovascular + Valvular Problems/Murmurs  Rhythm:Regular Rate:Normal     Neuro/Psych  Headaches,    GI/Hepatic Neg liver ROS, GERD  ,  Endo/Other  negative endocrine ROS  Renal/GU negative Renal ROS     Musculoskeletal  (+) Arthritis ,   Abdominal   Peds  Hematology  (+) anemia ,   Anesthesia Other Findings   Reproductive/Obstetrics                             Anesthesia Physical Anesthesia Plan  ASA: III  Anesthesia Plan: General   Post-op Pain Management:    Induction: Intravenous  PONV Risk Score and Plan: 3 and Ondansetron, Dexamethasone and Midazolam  Airway Management Planned: LMA  Additional Equipment:   Intra-op Plan:   Post-operative Plan: Extubation in OR  Informed Consent: I have reviewed the patients History and Physical, chart, labs and discussed the procedure including the risks, benefits and alternatives for the proposed anesthesia with the patient or authorized representative who has indicated his/her understanding and acceptance.     Dental advisory given  Plan Discussed with: CRNA and Anesthesiologist  Anesthesia Plan Comments:         Anesthesia Quick Evaluation

## 2019-08-11 NOTE — Transfer of Care (Signed)
Immediate Anesthesia Transfer of Care Note  Patient: Tonya Benson  Procedure(s) Performed: RADIOACTIVE SEED GUIDED RIGHT BREAST EXCISIONAL BIOPSY (Right Breast)  Patient Location: PACU  Anesthesia Type:General  Level of Consciousness: drowsy and patient cooperative  Airway & Oxygen Therapy: Patient Spontanous Breathing and Patient connected to face mask oxygen  Post-op Assessment: Report given to RN and Post -op Vital signs reviewed and stable  Post vital signs: Reviewed and stable  Last Vitals:  Vitals Value Taken Time  BP 105/62 08/11/19 1421  Temp    Pulse 65 08/11/19 1422  Resp 11 08/11/19 1422  SpO2 100 % 08/11/19 1422  Vitals shown include unvalidated device data.  Last Pain:  Vitals:   08/11/19 1121  TempSrc: Oral  PainSc: 0-No pain      Patients Stated Pain Goal: 4 ([id number])  Complications: No apparent anesthesia complications

## 2019-08-11 NOTE — Interval H&P Note (Signed)
History and Physical Interval Note:  08/11/2019 1:27 PM  Tonya Benson  has presented today for surgery, with the diagnosis of RIGHT BREAST MASS.  The various methods of treatment have been discussed with the patient and family. After consideration of risks, benefits and other options for treatment, the patient has consented to  Procedure(s): RADIOACTIVE SEED GUIDED RIGHT BREAST EXCISIONAL BIOPSY (Right) as a surgical intervention.  The patient's history has been reviewed, patient examined, no change in status, stable for surgery.  I have reviewed the patient's chart and labs.  Questions were answered to the patient's satisfaction.     Rolm Bookbinder

## 2019-08-12 ENCOUNTER — Encounter (HOSPITAL_BASED_OUTPATIENT_CLINIC_OR_DEPARTMENT_OTHER): Payer: Self-pay | Admitting: General Surgery

## 2019-08-13 LAB — SURGICAL PATHOLOGY

## 2019-10-14 ENCOUNTER — Other Ambulatory Visit: Payer: Self-pay | Admitting: Internal Medicine

## 2019-10-14 DIAGNOSIS — Z1231 Encounter for screening mammogram for malignant neoplasm of breast: Secondary | ICD-10-CM

## 2019-11-04 DIAGNOSIS — H40013 Open angle with borderline findings, low risk, bilateral: Secondary | ICD-10-CM | POA: Diagnosis not present

## 2019-11-04 DIAGNOSIS — H5213 Myopia, bilateral: Secondary | ICD-10-CM | POA: Diagnosis not present

## 2019-11-13 ENCOUNTER — Ambulatory Visit: Payer: No Typology Code available for payment source

## 2019-11-21 ENCOUNTER — Ambulatory Visit: Payer: No Typology Code available for payment source

## 2019-12-01 ENCOUNTER — Ambulatory Visit: Payer: No Typology Code available for payment source

## 2019-12-04 ENCOUNTER — Ambulatory Visit: Payer: No Typology Code available for payment source

## 2019-12-31 DIAGNOSIS — G4700 Insomnia, unspecified: Secondary | ICD-10-CM | POA: Diagnosis not present

## 2019-12-31 DIAGNOSIS — G43009 Migraine without aura, not intractable, without status migrainosus: Secondary | ICD-10-CM | POA: Diagnosis not present

## 2019-12-31 DIAGNOSIS — M170 Bilateral primary osteoarthritis of knee: Secondary | ICD-10-CM | POA: Diagnosis not present

## 2019-12-31 DIAGNOSIS — Z1389 Encounter for screening for other disorder: Secondary | ICD-10-CM | POA: Diagnosis not present

## 2019-12-31 DIAGNOSIS — K219 Gastro-esophageal reflux disease without esophagitis: Secondary | ICD-10-CM | POA: Diagnosis not present

## 2019-12-31 DIAGNOSIS — Z0001 Encounter for general adult medical examination with abnormal findings: Secondary | ICD-10-CM | POA: Diagnosis not present

## 2019-12-31 DIAGNOSIS — M8580 Other specified disorders of bone density and structure, unspecified site: Secondary | ICD-10-CM | POA: Diagnosis not present

## 2019-12-31 DIAGNOSIS — Z79899 Other long term (current) drug therapy: Secondary | ICD-10-CM | POA: Diagnosis not present

## 2019-12-31 DIAGNOSIS — B009 Herpesviral infection, unspecified: Secondary | ICD-10-CM | POA: Diagnosis not present

## 2019-12-31 DIAGNOSIS — Z853 Personal history of malignant neoplasm of breast: Secondary | ICD-10-CM | POA: Diagnosis not present

## 2019-12-31 DIAGNOSIS — E559 Vitamin D deficiency, unspecified: Secondary | ICD-10-CM | POA: Diagnosis not present

## 2019-12-31 DIAGNOSIS — E785 Hyperlipidemia, unspecified: Secondary | ICD-10-CM | POA: Diagnosis not present

## 2020-01-01 DIAGNOSIS — M1712 Unilateral primary osteoarthritis, left knee: Secondary | ICD-10-CM | POA: Diagnosis not present

## 2020-01-01 DIAGNOSIS — M25562 Pain in left knee: Secondary | ICD-10-CM | POA: Diagnosis not present

## 2020-01-01 DIAGNOSIS — M170 Bilateral primary osteoarthritis of knee: Secondary | ICD-10-CM | POA: Diagnosis not present

## 2020-01-01 DIAGNOSIS — M25561 Pain in right knee: Secondary | ICD-10-CM | POA: Diagnosis not present

## 2020-01-01 DIAGNOSIS — M1711 Unilateral primary osteoarthritis, right knee: Secondary | ICD-10-CM | POA: Diagnosis not present

## 2020-02-03 ENCOUNTER — Ambulatory Visit
Admission: RE | Admit: 2020-02-03 | Discharge: 2020-02-03 | Disposition: A | Discharge status: Home / Self Care | Payer: Medicare Other | Source: Ambulatory Visit | Attending: Internal Medicine | Admitting: Internal Medicine

## 2020-02-03 ENCOUNTER — Other Ambulatory Visit: Payer: Self-pay

## 2020-02-03 DIAGNOSIS — Z1231 Encounter for screening mammogram for malignant neoplasm of breast: Secondary | ICD-10-CM

## 2020-07-11 DIAGNOSIS — D225 Melanocytic nevi of trunk: Secondary | ICD-10-CM | POA: Diagnosis not present

## 2020-07-11 DIAGNOSIS — L814 Other melanin hyperpigmentation: Secondary | ICD-10-CM | POA: Diagnosis not present

## 2020-07-11 DIAGNOSIS — L718 Other rosacea: Secondary | ICD-10-CM | POA: Diagnosis not present

## 2020-07-11 DIAGNOSIS — L821 Other seborrheic keratosis: Secondary | ICD-10-CM | POA: Diagnosis not present

## 2020-07-11 DIAGNOSIS — L820 Inflamed seborrheic keratosis: Secondary | ICD-10-CM | POA: Diagnosis not present

## 2020-07-11 DIAGNOSIS — L570 Actinic keratosis: Secondary | ICD-10-CM | POA: Diagnosis not present

## 2020-07-14 DIAGNOSIS — Z23 Encounter for immunization: Secondary | ICD-10-CM | POA: Diagnosis not present

## 2020-10-17 DIAGNOSIS — M65331 Trigger finger, right middle finger: Secondary | ICD-10-CM | POA: Diagnosis not present

## 2020-10-18 ENCOUNTER — Other Ambulatory Visit: Payer: Self-pay | Admitting: Orthopedic Surgery

## 2020-10-31 ENCOUNTER — Other Ambulatory Visit: Payer: Self-pay

## 2020-10-31 ENCOUNTER — Encounter (HOSPITAL_BASED_OUTPATIENT_CLINIC_OR_DEPARTMENT_OTHER): Payer: Self-pay | Admitting: Orthopedic Surgery

## 2020-11-03 ENCOUNTER — Other Ambulatory Visit (HOSPITAL_COMMUNITY)
Admission: RE | Admit: 2020-11-03 | Discharge: 2020-11-03 | Disposition: A | Discharge status: Home / Self Care | Payer: Medicare Other | Source: Ambulatory Visit | Attending: Orthopedic Surgery | Admitting: Orthopedic Surgery

## 2020-11-03 DIAGNOSIS — Z01812 Encounter for preprocedural laboratory examination: Secondary | ICD-10-CM | POA: Diagnosis not present

## 2020-11-03 DIAGNOSIS — Z20822 Contact with and (suspected) exposure to covid-19: Secondary | ICD-10-CM | POA: Insufficient documentation

## 2020-11-04 LAB — SARS CORONAVIRUS 2 (TAT 6-24 HRS): SARS Coronavirus 2: NEGATIVE

## 2020-11-07 ENCOUNTER — Encounter (HOSPITAL_COMMUNITY): Payer: Self-pay | Admitting: Anesthesiology

## 2020-11-10 DIAGNOSIS — D2339 Other benign neoplasm of skin of other parts of face: Secondary | ICD-10-CM | POA: Diagnosis not present

## 2020-11-10 DIAGNOSIS — D485 Neoplasm of uncertain behavior of skin: Secondary | ICD-10-CM | POA: Diagnosis not present

## 2020-11-10 DIAGNOSIS — L570 Actinic keratosis: Secondary | ICD-10-CM | POA: Diagnosis not present

## 2020-11-10 DIAGNOSIS — L718 Other rosacea: Secondary | ICD-10-CM | POA: Diagnosis not present

## 2020-11-24 ENCOUNTER — Encounter (HOSPITAL_BASED_OUTPATIENT_CLINIC_OR_DEPARTMENT_OTHER): Payer: Self-pay | Admitting: Orthopedic Surgery

## 2020-11-28 ENCOUNTER — Other Ambulatory Visit (HOSPITAL_COMMUNITY): Payer: No Typology Code available for payment source

## 2020-11-29 ENCOUNTER — Other Ambulatory Visit (HOSPITAL_COMMUNITY)
Admission: RE | Admit: 2020-11-29 | Discharge: 2020-11-29 | Disposition: A | Discharge status: Home / Self Care | Payer: Medicare Other | Source: Ambulatory Visit | Attending: Orthopedic Surgery | Admitting: Orthopedic Surgery

## 2020-11-29 DIAGNOSIS — Z20822 Contact with and (suspected) exposure to covid-19: Secondary | ICD-10-CM | POA: Diagnosis not present

## 2020-11-29 DIAGNOSIS — Z01812 Encounter for preprocedural laboratory examination: Secondary | ICD-10-CM | POA: Diagnosis not present

## 2020-11-29 LAB — SARS CORONAVIRUS 2 (TAT 6-24 HRS): SARS Coronavirus 2: NEGATIVE

## 2020-11-30 ENCOUNTER — Other Ambulatory Visit: Payer: Self-pay | Admitting: Orthopedic Surgery

## 2020-12-01 ENCOUNTER — Ambulatory Visit (HOSPITAL_BASED_OUTPATIENT_CLINIC_OR_DEPARTMENT_OTHER): Admission: RE | Admit: 2020-12-01 | Payer: Medicare Other | Source: Home / Self Care | Admitting: Orthopedic Surgery

## 2020-12-01 SURGERY — RELEASE, A1 PULLEY, FOR TRIGGER FINGER
Anesthesia: Choice | Laterality: Right

## 2020-12-09 ENCOUNTER — Encounter (HOSPITAL_BASED_OUTPATIENT_CLINIC_OR_DEPARTMENT_OTHER): Payer: Self-pay | Admitting: Orthopedic Surgery

## 2020-12-09 ENCOUNTER — Other Ambulatory Visit: Payer: Self-pay

## 2020-12-13 ENCOUNTER — Other Ambulatory Visit (HOSPITAL_COMMUNITY): Payer: No Typology Code available for payment source

## 2020-12-14 ENCOUNTER — Other Ambulatory Visit (HOSPITAL_COMMUNITY)
Admission: RE | Admit: 2020-12-14 | Discharge: 2020-12-14 | Disposition: A | Discharge status: Home / Self Care | Payer: Medicare Other | Source: Ambulatory Visit | Attending: Orthopedic Surgery | Admitting: Orthopedic Surgery

## 2020-12-14 DIAGNOSIS — Z20822 Contact with and (suspected) exposure to covid-19: Secondary | ICD-10-CM | POA: Diagnosis not present

## 2020-12-14 DIAGNOSIS — Z01812 Encounter for preprocedural laboratory examination: Secondary | ICD-10-CM | POA: Insufficient documentation

## 2020-12-14 LAB — SARS CORONAVIRUS 2 (TAT 6-24 HRS): SARS Coronavirus 2: NEGATIVE

## 2020-12-16 ENCOUNTER — Ambulatory Visit (HOSPITAL_BASED_OUTPATIENT_CLINIC_OR_DEPARTMENT_OTHER): Payer: Medicare Other | Admitting: Anesthesiology

## 2020-12-16 ENCOUNTER — Encounter (HOSPITAL_BASED_OUTPATIENT_CLINIC_OR_DEPARTMENT_OTHER): Payer: Self-pay | Admitting: Orthopedic Surgery

## 2020-12-16 ENCOUNTER — Ambulatory Visit (HOSPITAL_BASED_OUTPATIENT_CLINIC_OR_DEPARTMENT_OTHER)
Admission: RE | Admit: 2020-12-16 | Discharge: 2020-12-16 | Disposition: A | Discharge status: Home / Self Care | Payer: Medicare Other | Attending: Orthopedic Surgery | Admitting: Orthopedic Surgery

## 2020-12-16 ENCOUNTER — Encounter (HOSPITAL_BASED_OUTPATIENT_CLINIC_OR_DEPARTMENT_OTHER)
Admission: RE | Disposition: A | Discharge status: Home / Self Care | Payer: Self-pay | Source: Home / Self Care | Attending: Orthopedic Surgery

## 2020-12-16 ENCOUNTER — Other Ambulatory Visit: Payer: Self-pay

## 2020-12-16 DIAGNOSIS — M65331 Trigger finger, right middle finger: Secondary | ICD-10-CM | POA: Insufficient documentation

## 2020-12-16 DIAGNOSIS — Z791 Long term (current) use of non-steroidal anti-inflammatories (NSAID): Secondary | ICD-10-CM | POA: Insufficient documentation

## 2020-12-16 DIAGNOSIS — Z79899 Other long term (current) drug therapy: Secondary | ICD-10-CM | POA: Diagnosis not present

## 2020-12-16 DIAGNOSIS — Z853 Personal history of malignant neoplasm of breast: Secondary | ICD-10-CM | POA: Insufficient documentation

## 2020-12-16 DIAGNOSIS — Z809 Family history of malignant neoplasm, unspecified: Secondary | ICD-10-CM | POA: Insufficient documentation

## 2020-12-16 DIAGNOSIS — Z96641 Presence of right artificial hip joint: Secondary | ICD-10-CM | POA: Insufficient documentation

## 2020-12-16 DIAGNOSIS — Z923 Personal history of irradiation: Secondary | ICD-10-CM | POA: Diagnosis not present

## 2020-12-16 DIAGNOSIS — M1990 Unspecified osteoarthritis, unspecified site: Secondary | ICD-10-CM | POA: Diagnosis not present

## 2020-12-16 DIAGNOSIS — K219 Gastro-esophageal reflux disease without esophagitis: Secondary | ICD-10-CM | POA: Diagnosis not present

## 2020-12-16 HISTORY — PX: TRIGGER FINGER RELEASE: SHX641

## 2020-12-16 SURGERY — RELEASE, A1 PULLEY, FOR TRIGGER FINGER
Anesthesia: General | Site: Hand | Laterality: Right

## 2020-12-16 MED ORDER — LIDOCAINE HCL (CARDIAC) PF 100 MG/5ML IV SOSY
PREFILLED_SYRINGE | INTRAVENOUS | Status: DC | PRN
  Administered 2020-12-16: 60 mg via INTRAVENOUS

## 2020-12-16 MED ORDER — CEFAZOLIN SODIUM-DEXTROSE 2-4 GM/100ML-% IV SOLN
INTRAVENOUS | Status: AC
  Filled 2020-12-16: qty 100

## 2020-12-16 MED ORDER — ONDANSETRON HCL 4 MG/2ML IJ SOLN: 4.0000 mg | Freq: Once | INTRAMUSCULAR | Status: DC | PRN

## 2020-12-16 MED ORDER — OXYCODONE HCL 5 MG/5ML PO SOLN: 5.0000 mg | Freq: Once | ORAL | Status: DC | PRN

## 2020-12-16 MED ORDER — BUPIVACAINE HCL (PF) 0.25 % IJ SOLN
INTRAMUSCULAR | Status: DC | PRN
  Administered 2020-12-16: 7 mL

## 2020-12-16 MED ORDER — OXYCODONE HCL 5 MG PO TABS: 5.0000 mg | ORAL_TABLET | Freq: Once | ORAL | Status: DC | PRN

## 2020-12-16 MED ORDER — MIDAZOLAM HCL 2 MG/2ML IJ SOLN
INTRAMUSCULAR | Status: AC
  Filled 2020-12-16: qty 2

## 2020-12-16 MED ORDER — DEXAMETHASONE SODIUM PHOSPHATE 10 MG/ML IJ SOLN
INTRAMUSCULAR | Status: DC | PRN
  Administered 2020-12-16: 10 mg via INTRAVENOUS

## 2020-12-16 MED ORDER — ONDANSETRON HCL 4 MG/2ML IJ SOLN
INTRAMUSCULAR | Status: DC | PRN
  Administered 2020-12-16: 4 mg via INTRAVENOUS

## 2020-12-16 MED ORDER — FENTANYL CITRATE (PF) 100 MCG/2ML IJ SOLN
INTRAMUSCULAR | Status: DC | PRN
  Administered 2020-12-16 (×2): 50 ug via INTRAVENOUS

## 2020-12-16 MED ORDER — PROPOFOL 10 MG/ML IV BOLUS
INTRAVENOUS | Status: DC | PRN
  Administered 2020-12-16: 200 mg via INTRAVENOUS

## 2020-12-16 MED ORDER — LACTATED RINGERS IV SOLN: INTRAVENOUS | Status: DC | PRN

## 2020-12-16 MED ORDER — CEFAZOLIN SODIUM-DEXTROSE 2-4 GM/100ML-% IV SOLN
2.0000 g | INTRAVENOUS | Status: AC
  Administered 2020-12-16: 2 g via INTRAVENOUS

## 2020-12-16 MED ORDER — FENTANYL CITRATE (PF) 100 MCG/2ML IJ SOLN
INTRAMUSCULAR | Status: AC
  Filled 2020-12-16: qty 2

## 2020-12-16 MED ORDER — FENTANYL CITRATE (PF) 100 MCG/2ML IJ SOLN: 25.0000 ug | INTRAMUSCULAR | Status: DC | PRN

## 2020-12-16 MED ORDER — HYDROCODONE-ACETAMINOPHEN 5-325 MG PO TABS: ORAL_TABLET | ORAL | 0 refills | Status: AC

## 2020-12-16 SURGICAL SUPPLY — 34 items
APL PRP STRL LF DISP 70% ISPRP (MISCELLANEOUS) ×1
BLADE SURG 15 STRL LF DISP TIS (BLADE) ×2 IMPLANT
BLADE SURG 15 STRL SS (BLADE) ×4
BNDG CMPR 9X4 STRL LF SNTH (GAUZE/BANDAGES/DRESSINGS) ×1
BNDG COHESIVE 2X5 TAN STRL LF (GAUZE/BANDAGES/DRESSINGS) ×2 IMPLANT
BNDG ESMARK 4X9 LF (GAUZE/BANDAGES/DRESSINGS) ×1 IMPLANT
CHLORAPREP W/TINT 26 (MISCELLANEOUS) ×2 IMPLANT
CORD BIPOLAR FORCEPS 12FT (ELECTRODE) ×2 IMPLANT
COVER BACK TABLE 60X90IN (DRAPES) ×2 IMPLANT
COVER MAYO STAND STRL (DRAPES) ×2 IMPLANT
COVER WAND RF STERILE (DRAPES) IMPLANT
CUFF TOURN SGL QUICK 18X4 (TOURNIQUET CUFF) ×2 IMPLANT
DRAPE EXTREMITY T 121X128X90 (DISPOSABLE) ×2 IMPLANT
DRAPE SURG 17X23 STRL (DRAPES) ×2 IMPLANT
GAUZE SPONGE 4X4 12PLY STRL (GAUZE/BANDAGES/DRESSINGS) ×2 IMPLANT
GAUZE XEROFORM 1X8 LF (GAUZE/BANDAGES/DRESSINGS) ×2 IMPLANT
GLOVE SRG 8 PF TXTR STRL LF DI (GLOVE) ×1 IMPLANT
GLOVE SURG ENC MOIS LTX SZ7.5 (GLOVE) ×2 IMPLANT
GLOVE SURG LTX SZ6.5 (GLOVE) ×1 IMPLANT
GLOVE SURG UNDER POLY LF SZ7 (GLOVE) ×2 IMPLANT
GLOVE SURG UNDER POLY LF SZ8 (GLOVE) ×2
GOWN STRL REUS W/ TWL LRG LVL3 (GOWN DISPOSABLE) ×1 IMPLANT
GOWN STRL REUS W/TWL LRG LVL3 (GOWN DISPOSABLE) ×2
GOWN STRL REUS W/TWL XL LVL3 (GOWN DISPOSABLE) ×2 IMPLANT
NDL HYPO 25X1 1.5 SAFETY (NEEDLE) ×1 IMPLANT
NEEDLE HYPO 25X1 1.5 SAFETY (NEEDLE) ×2 IMPLANT
NS IRRIG 1000ML POUR BTL (IV SOLUTION) ×2 IMPLANT
PACK BASIN DAY SURGERY FS (CUSTOM PROCEDURE TRAY) ×2 IMPLANT
STOCKINETTE 4X48 STRL (DRAPES) ×2 IMPLANT
SUT ETHILON 4 0 PS 2 18 (SUTURE) ×2 IMPLANT
SYR BULB EAR ULCER 3OZ GRN STR (SYRINGE) ×2 IMPLANT
SYR CONTROL 10ML LL (SYRINGE) ×2 IMPLANT
TOWEL GREEN STERILE FF (TOWEL DISPOSABLE) ×3 IMPLANT
UNDERPAD 30X36 HEAVY ABSORB (UNDERPADS AND DIAPERS) ×2 IMPLANT

## 2020-12-16 NOTE — Discharge Instructions (Addendum)

## 2020-12-16 NOTE — Anesthesia Procedure Notes (Signed)
Procedure Name: LMA Insertion Date/Time: 12/16/2020 1:25 PM Performed by: Willa Frater, CRNA Pre-anesthesia Checklist: Patient identified, Emergency Drugs available, Suction available and Patient being monitored Patient Re-evaluated:Patient Re-evaluated prior to induction Oxygen Delivery Method: Circle system utilized Preoxygenation: Pre-oxygenation with 100% oxygen Induction Type: IV induction Ventilation: Mask ventilation without difficulty LMA: LMA inserted LMA Size: 4.0 Number of attempts: 1 Airway Equipment and Method: Bite block Placement Confirmation: positive ETCO2 Tube secured with: Tape Dental Injury: Teeth and Oropharynx as per pre-operative assessment

## 2020-12-16 NOTE — Anesthesia Preprocedure Evaluation (Signed)
Anesthesia Evaluation  Patient identified by MRN, date of birth, ID band Patient awake    Reviewed: Allergy & Precautions, NPO status , Patient's Chart, lab work & pertinent test results  Airway Mallampati: II  TM Distance: >3 FB Neck ROM: Full    Dental no notable dental hx. (+) Teeth Intact, Caps   Pulmonary neg pulmonary ROS,    Pulmonary exam normal breath sounds clear to auscultation       Cardiovascular Normal cardiovascular exam+ Valvular Problems/Murmurs  Rhythm:Regular Rate:Normal     Neuro/Psych  Headaches, negative psych ROS   GI/Hepatic Neg liver ROS, GERD  Medicated and Controlled,  Endo/Other  Hx/o right breast Ca S/P lumpectomy with sentinel ln  Renal/GU negative Renal ROS  negative genitourinary   Musculoskeletal  (+) Arthritis ,   Abdominal   Peds  Hematology  (+) anemia ,   Anesthesia Other Findings   Reproductive/Obstetrics                             Anesthesia Physical Anesthesia Plan  ASA: II  Anesthesia Plan: General   Post-op Pain Management:    Induction: Intravenous  PONV Risk Score and Plan: Treatment may vary due to age or medical condition and Ondansetron  Airway Management Planned: LMA  Additional Equipment:   Intra-op Plan:   Post-operative Plan: Extubation in OR  Informed Consent: I have reviewed the patients History and Physical, chart, labs and discussed the procedure including the risks, benefits and alternatives for the proposed anesthesia with the patient or authorized representative who has indicated his/her understanding and acceptance.     Dental advisory given  Plan Discussed with: Anesthesiologist and CRNA  Anesthesia Plan Comments:         Anesthesia Quick Evaluation

## 2020-12-16 NOTE — Op Note (Signed)
12/16/2020 [facility]  Operative Note  PREOPERATIVE DIAGNOSIS: RIGHT LONG FINGER TRIGGER DIGIT  POSTOPERATIVE DIAGNOSIS:  RIGHT LONG FINGER TRIGGER DIGIT  PROCEDURE: Procedure(s): RELEASE TRIGGER FINGER/A-1 PULLEY, RIGHT LONG FINGER long finger  SURGEON:  Leanora Cover, MD  ASSISTANT:  none.  ANESTHESIA:  General.  IV FLUIDS:  Per anesthesia flow sheet.  ESTIMATED BLOOD LOSS:  Minimal.  COMPLICATIONS:  None.  SPECIMENS:  None.  TOURNIQUET TIME:  Total Tourniquet Time Documented: Forearm (Right) - 8 minutes Total: Forearm (Right) - 8 minutes   DISPOSITION:  Stable to PACU.  LOCATION: [facility]  INDICATIONS: Tonya Benson is a 75 y.o. female with triggering right long finger.  She has had previous trigger digits requiring release.  She wishes to have surgical release right long finger trigger digit.  Risks, benefits and alternatives of surgery were discussed including the risk of blood loss, infection, damage to nerves, vessels, tendons, ligaments, bone, failure of surgery, need for additional surgery, complications with wound healing, continued pain, continued triggering and need for repeat surgery.  She voiced understanding of these risks and elected to proceed.  OPERATIVE COURSE:  After being identified preoperatively by myself, the patient and I agreed upon the procedure and site of procedure.  The surgical site was marked. Surgical consent had been signed. She was given IV Ancef as preoperative antibiotic prophylaxis. She was transported to the operating room and placed on the operating room table in supine position with the Right upper extremity on an arm board. General anesthesia was induced by the anesthesiologist.  The Right upper extremity was prepped and draped in normal sterile orthopedic fashion. A surgical pause was performed between surgeons, anesthesia, and operating room staff, and all were in agreement as to the patient, procedure,  and site of procedure.  Tourniquet at the proximal aspect of the forearm was inflated to 250 mmHg after exsanguination of the arm with an Esmarch bandage.  An incision was made at the volar aspect of the MP joint of the long finger.  This was carried into the subcutaneous tissues by preading technique.  Bipolar electrocautery was used to obtain hemostasis.  The radial and ulnar digital nerves were protected throughout the case. The flexor sheath was identified.  The A1 pulley was identified and sharply incised.  It was released in its entirety.  The proximal 1-2 mm of the A2 pulley was vented to allow better excursion of the tendons.  The finger was placed through a range of motion and there was noted to be no catching.  The tendons were brought through the wound and any adherences released.  The wound was then copiously irrigated with sterile saline. It was closed with 4-0 nylon in a horizontal mattress fashion.  It was injected with 0.25% plain Marcaine to aid in postoperative analgesia.  It was dressed with sterile Xeroform, 4x4s, and wrapped lightly with a Coban dressing.  Tourniquet was deflated at 8 minutes.  The fingertips were pink with brisk capillary refill after deflation of the tourniquet.  The operative drapes were broken down and the patient was awoken from anesthesia safely.  She was transferred back to the stretcher and taken to the PACU in stable condition.   I will see her back in the office in 1 week for postoperative followup.  I will give her a prescription for Norco 5/325 1-2 tabs PO q6 hours prn pain, dispense # 15.    Leanora Cover, MD Electronically signed, 12/16/20

## 2020-12-16 NOTE — Transfer of Care (Signed)
Immediate Anesthesia Transfer of Care Note  Patient: Tonya Benson  Procedure(s) Performed: RELEASE TRIGGER FINGER/A-1 PULLEY, RIGHT LONG FINGER (Right Hand)  Patient Location: PACU  Anesthesia Type:General  Level of Consciousness: awake, alert , oriented, drowsy and patient cooperative  Airway & Oxygen Therapy: Patient Spontanous Breathing and Patient connected to face mask oxygen  Post-op Assessment: Report given to RN and Post -op Vital signs reviewed and stable  Post vital signs: Reviewed and stable  Last Vitals:  Vitals Value Taken Time  BP    Temp    Pulse 67 12/16/20 1359  Resp 12 12/16/20 1359  SpO2 100 % 12/16/20 1359  Vitals shown include unvalidated device data.  Last Pain:  Vitals:   12/16/20 1212  TempSrc: Oral  PainSc: 0-No pain         Complications: No complications documented.

## 2020-12-16 NOTE — Anesthesia Procedure Notes (Signed)
Performed by: Willa Frater, CRNA

## 2020-12-16 NOTE — H&P (Signed)
Tonya Benson is an 75 y.o. female.   Chief Complaint: trigger digit HPI: 75 yo female with triggering right long finger.  She has had previous trigger digits requiring surgical release.  She wishes to proceed with surgical release right long finger trigger digit.  Allergies:  Allergies  Allergen Reactions  . Adhesive [Tape] Rash    Past Medical History:  Diagnosis Date  . Arthritis   . Cancer [facility]) 2006   right breast- over 30 radiation treatments  . GERD (gastroesophageal reflux disease)    occasional  . Headache(784.0)    in the past, none recent  . Heart murmur    slight    Past Surgical History:  Procedure Laterality Date  . BREAST BIOPSY Right 2006  . BREAST LUMPECTOMY Right 2006  . RADIOACTIVE SEED GUIDED EXCISIONAL BREAST BIOPSY Right 08/11/2019   Procedure: RADIOACTIVE SEED GUIDED RIGHT BREAST EXCISIONAL BIOPSY;  Surgeon: Rolm Bookbinder, MD;  Location: [facility];  Service: General;  Laterality: Right;  . right breast lumpectomy 2006 Right 2006  . tigger finger  Left 2017   left middle finger  . TOTAL HIP ARTHROPLASTY Right 02/02/2014   Procedure: RIGHT TOTAL HIP ARTHROPLASTY ANTERIOR APPROACH;  Surgeon: Mauri Pole, MD;  Location: [facility];  Service: Orthopedics;  Laterality: Right;    Family History: Family History  Problem Relation Age of Onset  . Cancer Mother   . Arthritis Mother   . Heart disease Father   . Lung disease Father     Social History:   reports that she has never smoked. She has never used smokeless tobacco. She reports current alcohol use. She reports that she does not use drugs.  Medications: Medications Prior to Admission  Medication Sig Dispense Refill  . celecoxib (CELEBREX) 200 MG capsule Take 200 mg by mouth daily.    . cholecalciferol (VITAMIN D) 1000 UNITS tablet Take 1,000 Units by mouth daily.    Marland Kitchen esomeprazole (NEXIUM) 20 MG capsule Take 20-40 mg by mouth daily as needed (acid reflux).     Marland Kitchen ibuprofen  (ADVIL) 200 MG tablet Take 400 mg by mouth every 6 (six) hours as needed.    . valACYclovir (VALTREX) 1000 MG tablet Take 500 mg by mouth daily. Takes 1/2 tablet      Results for orders placed or performed during the hospital encounter of 12/14/20 (from the past 48 hour(s))  SARS CORONAVIRUS 2 (TAT 6-24 HRS) Nasopharyngeal Nasopharyngeal Swab     Status: None   Collection Time: 12/14/20  2:17 PM   Specimen: Nasopharyngeal Swab  Result Value Ref Range   SARS Coronavirus 2 NEGATIVE NEGATIVE    Comment: (NOTE) SARS-CoV-2 target nucleic acids are NOT DETECTED.  The SARS-CoV-2 RNA is generally detectable in upper and lower respiratory specimens during the acute phase of infection. Negative results do not preclude SARS-CoV-2 infection, do not rule out co-infections with other pathogens, and should not be used as the sole basis for treatment or other patient management decisions. Negative results must be combined with clinical observations, patient history, and epidemiological information. The expected result is Negative.  Fact Sheet for Patients: SugarRoll.be  Fact Sheet for Healthcare Providers: https://www.woods-mathews.com/  This test is not yet approved or cleared by the [place] FDA and  has been authorized for detection and/or diagnosis of SARS-CoV-2 by FDA under an Emergency Use Authorization (EUA). This EUA will remain  in effect (meaning this test can be used) for the duration of the COVID-19 declaration under  Se ction 564(b)(1) of the Act, 21 U.S.C. section 360bbb-3(b)(1), unless the authorization is terminated or revoked sooner.  Performed at [facility], [place] [address] [place], [place] [address]     No results found.   A comprehensive review of systems was negative.  Blood pressure 104/80, pulse 91, temperature 97.7 F (36.5 C), temperature source Oral, resp. rate 20, height 5\' 8"  (1.727 m), weight 86.2 kg, SpO2  99 %.  General appearance: alert, cooperative and appears stated age Head: Normocephalic, without obvious abnormality, atraumatic Neck: supple, symmetrical, trachea midline Cardio: regular rate and rhythm Resp: clear to auscultation bilaterally Extremities: Intact sensation and capillary refill all digits.  +epl/fpl/io.  No wounds.  Pulses: 2+ and symmetric Skin: Skin color, texture, turgor normal. No rashes or lesions Neurologic: Grossly normal Incision/Wound: none  Assessment/Plan Right long finger trigger digit.  Non operative and operative treatment options have been discussed with the patient and patient wishes to proceed with operative treatment. Risks, benefits, and alternatives of surgery have been discussed and the patient agrees with the plan of care.   Leanora Cover 12/16/2020, 12:18 PM

## 2020-12-19 ENCOUNTER — Encounter (HOSPITAL_BASED_OUTPATIENT_CLINIC_OR_DEPARTMENT_OTHER): Payer: Self-pay | Admitting: Orthopedic Surgery

## 2020-12-19 NOTE — Anesthesia Postprocedure Evaluation (Signed)
Anesthesia Post Note  Patient: Tonya Benson  Procedure(s) Performed: RELEASE TRIGGER FINGER/A-1 PULLEY, RIGHT LONG FINGER (Right Hand)     Patient location during evaluation: PACU Anesthesia Type: General Level of consciousness: awake and alert Pain management: pain level controlled Vital Signs Assessment: post-procedure vital signs reviewed and stable Respiratory status: spontaneous breathing, nonlabored ventilation, respiratory function stable and patient connected to nasal cannula oxygen Cardiovascular status: blood pressure returned to baseline and stable Postop Assessment: no apparent nausea or vomiting Anesthetic complications: no   No complications documented.  Last Vitals:  Vitals:   12/16/20 1415 12/16/20 1430  BP: (!) 119/91 113/76  Pulse: 64 66  Resp: 13 16  Temp:  36.6 C  SpO2: 94% 100%    Last Pain:  Vitals:   12/16/20 1440  TempSrc:   PainSc: 0-No pain   Pain Goal:                   Tiajuana Amass

## 2020-12-23 ENCOUNTER — Other Ambulatory Visit: Payer: Self-pay | Admitting: Internal Medicine

## 2020-12-23 DIAGNOSIS — Z1231 Encounter for screening mammogram for malignant neoplasm of breast: Secondary | ICD-10-CM

## 2021-01-11 DIAGNOSIS — Z79899 Other long term (current) drug therapy: Secondary | ICD-10-CM | POA: Diagnosis not present

## 2021-01-11 DIAGNOSIS — M170 Bilateral primary osteoarthritis of knee: Secondary | ICD-10-CM | POA: Diagnosis not present

## 2021-01-11 DIAGNOSIS — E785 Hyperlipidemia, unspecified: Secondary | ICD-10-CM | POA: Diagnosis not present

## 2021-01-11 DIAGNOSIS — K219 Gastro-esophageal reflux disease without esophagitis: Secondary | ICD-10-CM | POA: Diagnosis not present

## 2021-01-11 DIAGNOSIS — Z1211 Encounter for screening for malignant neoplasm of colon: Secondary | ICD-10-CM | POA: Diagnosis not present

## 2021-01-11 DIAGNOSIS — G4700 Insomnia, unspecified: Secondary | ICD-10-CM | POA: Diagnosis not present

## 2021-01-11 DIAGNOSIS — Z853 Personal history of malignant neoplasm of breast: Secondary | ICD-10-CM | POA: Diagnosis not present

## 2021-01-11 DIAGNOSIS — Z0000 Encounter for general adult medical examination without abnormal findings: Secondary | ICD-10-CM | POA: Diagnosis not present

## 2021-01-11 DIAGNOSIS — M8580 Other specified disorders of bone density and structure, unspecified site: Secondary | ICD-10-CM | POA: Diagnosis not present

## 2021-01-11 DIAGNOSIS — E559 Vitamin D deficiency, unspecified: Secondary | ICD-10-CM | POA: Diagnosis not present

## 2021-01-11 DIAGNOSIS — G43009 Migraine without aura, not intractable, without status migrainosus: Secondary | ICD-10-CM | POA: Diagnosis not present

## 2021-01-11 DIAGNOSIS — B009 Herpesviral infection, unspecified: Secondary | ICD-10-CM | POA: Diagnosis not present

## 2021-02-15 ENCOUNTER — Ambulatory Visit: Payer: No Typology Code available for payment source

## 2021-02-17 DIAGNOSIS — H40053 Ocular hypertension, bilateral: Secondary | ICD-10-CM | POA: Diagnosis not present

## 2021-02-17 DIAGNOSIS — H2513 Age-related nuclear cataract, bilateral: Secondary | ICD-10-CM | POA: Diagnosis not present

## 2021-02-17 DIAGNOSIS — H5213 Myopia, bilateral: Secondary | ICD-10-CM | POA: Diagnosis not present

## 2021-04-18 ENCOUNTER — Ambulatory Visit
Admission: RE | Admit: 2021-04-18 | Discharge: 2021-04-18 | Disposition: A | Discharge status: Home / Self Care | Payer: Medicare Other | Source: Ambulatory Visit | Attending: Internal Medicine | Admitting: Internal Medicine

## 2021-04-18 ENCOUNTER — Other Ambulatory Visit: Payer: Self-pay

## 2021-04-18 DIAGNOSIS — Z1231 Encounter for screening mammogram for malignant neoplasm of breast: Secondary | ICD-10-CM | POA: Diagnosis not present

## 2021-04-18 HISTORY — DX: Malignant neoplasm of unspecified site of unspecified female breast: C50.919

## 2021-04-20 ENCOUNTER — Other Ambulatory Visit: Payer: Self-pay | Admitting: Internal Medicine

## 2021-04-20 DIAGNOSIS — R928 Other abnormal and inconclusive findings on diagnostic imaging of breast: Secondary | ICD-10-CM

## 2021-04-24 ENCOUNTER — Other Ambulatory Visit: Payer: Self-pay

## 2021-04-24 ENCOUNTER — Ambulatory Visit
Admission: RE | Admit: 2021-04-24 | Discharge: 2021-04-24 | Disposition: A | Discharge status: Home / Self Care | Payer: Medicare Other | Source: Ambulatory Visit | Attending: Internal Medicine | Admitting: Internal Medicine

## 2021-04-24 DIAGNOSIS — R928 Other abnormal and inconclusive findings on diagnostic imaging of breast: Secondary | ICD-10-CM

## 2021-04-24 DIAGNOSIS — Z853 Personal history of malignant neoplasm of breast: Secondary | ICD-10-CM | POA: Diagnosis not present

## 2021-04-24 DIAGNOSIS — R922 Inconclusive mammogram: Secondary | ICD-10-CM | POA: Diagnosis not present

## 2021-05-12 DIAGNOSIS — D122 Benign neoplasm of ascending colon: Secondary | ICD-10-CM | POA: Diagnosis not present

## 2021-05-12 DIAGNOSIS — Z1211 Encounter for screening for malignant neoplasm of colon: Secondary | ICD-10-CM | POA: Diagnosis not present

## 2021-05-12 DIAGNOSIS — K648 Other hemorrhoids: Secondary | ICD-10-CM | POA: Diagnosis not present

## 2021-05-16 DIAGNOSIS — D122 Benign neoplasm of ascending colon: Secondary | ICD-10-CM | POA: Diagnosis not present

## 2021-06-19 IMAGING — MG MM BREAST LOCALIZATION CLIP
4 series · 4 of 12 positions shown · non-contrast
Comparison: Previous exam(s).

CLINICAL DATA: Post biopsy mammogram of the right breast for clip
placement.

EXAM:
DIAGNOSTIC RIGHT MAMMOGRAM POST ULTRASOUND BIOPSY

[R ML synth-2D]
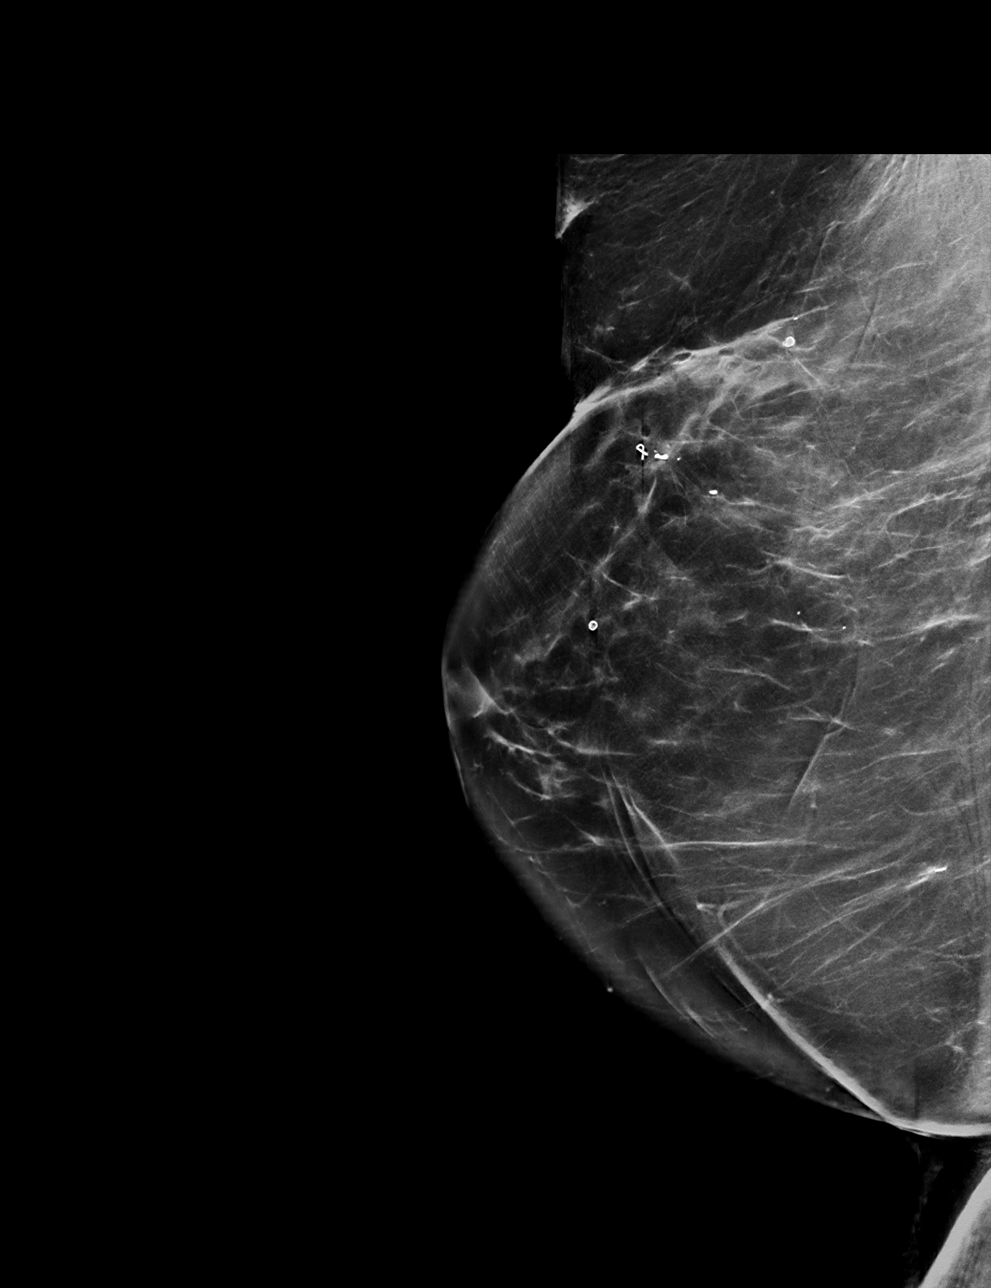

[R CC synth-2D]
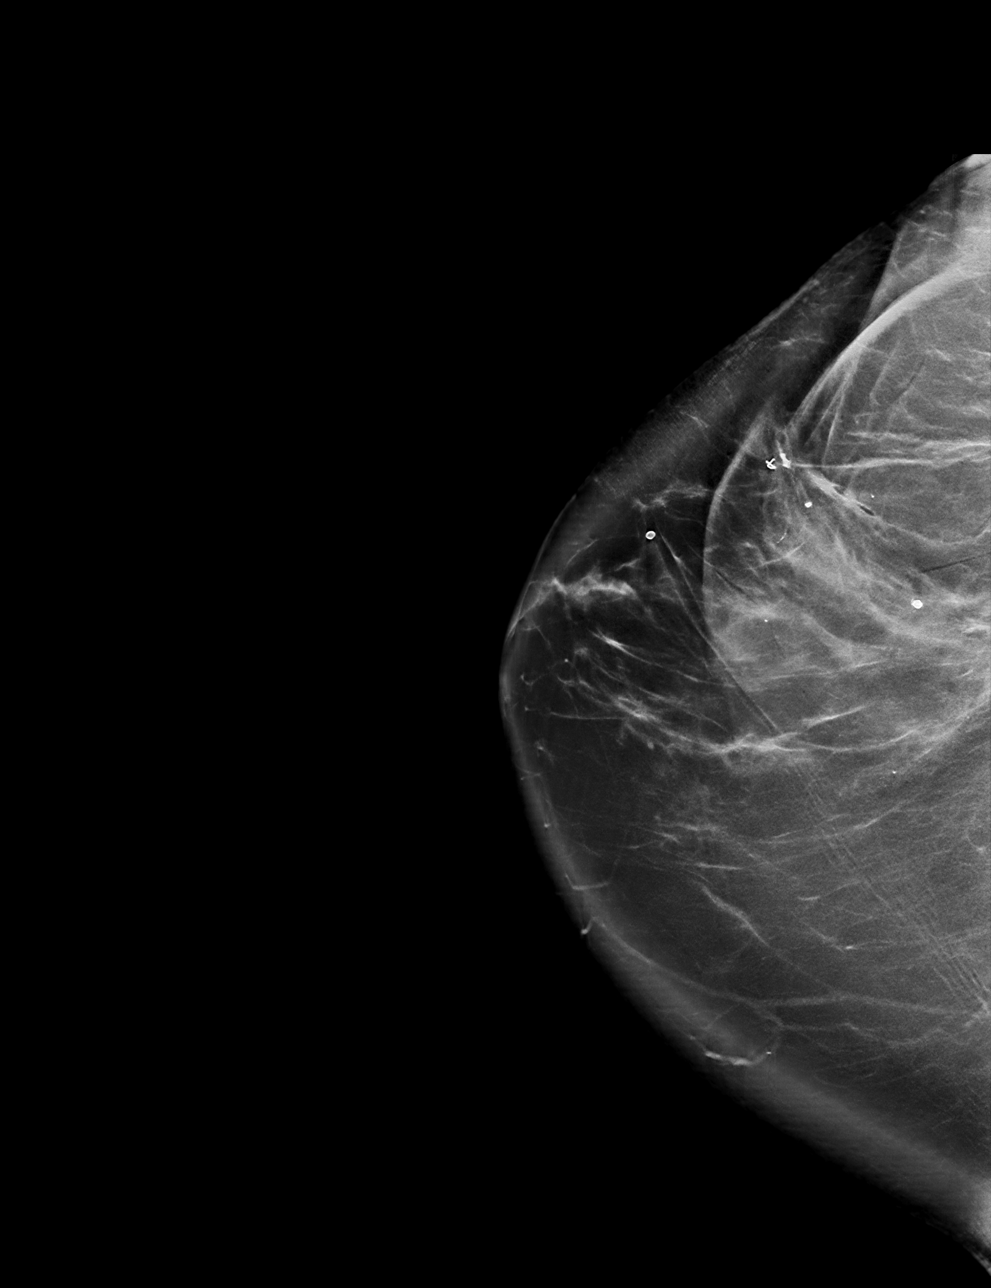

[R ML tomo · tomo slice 47/94.0]
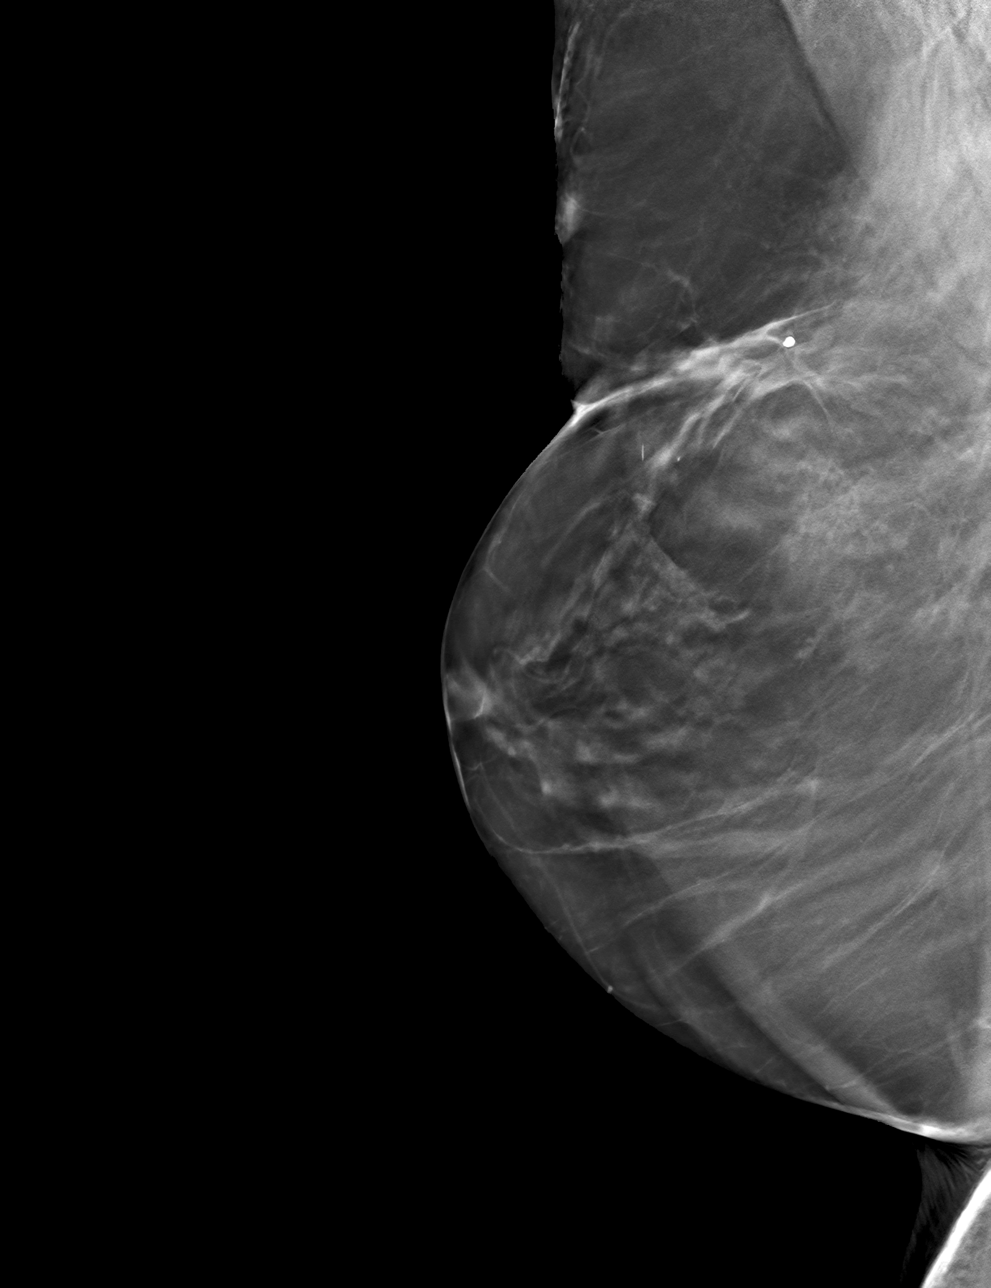

[R CC tomo · tomo slice 51/102.0]
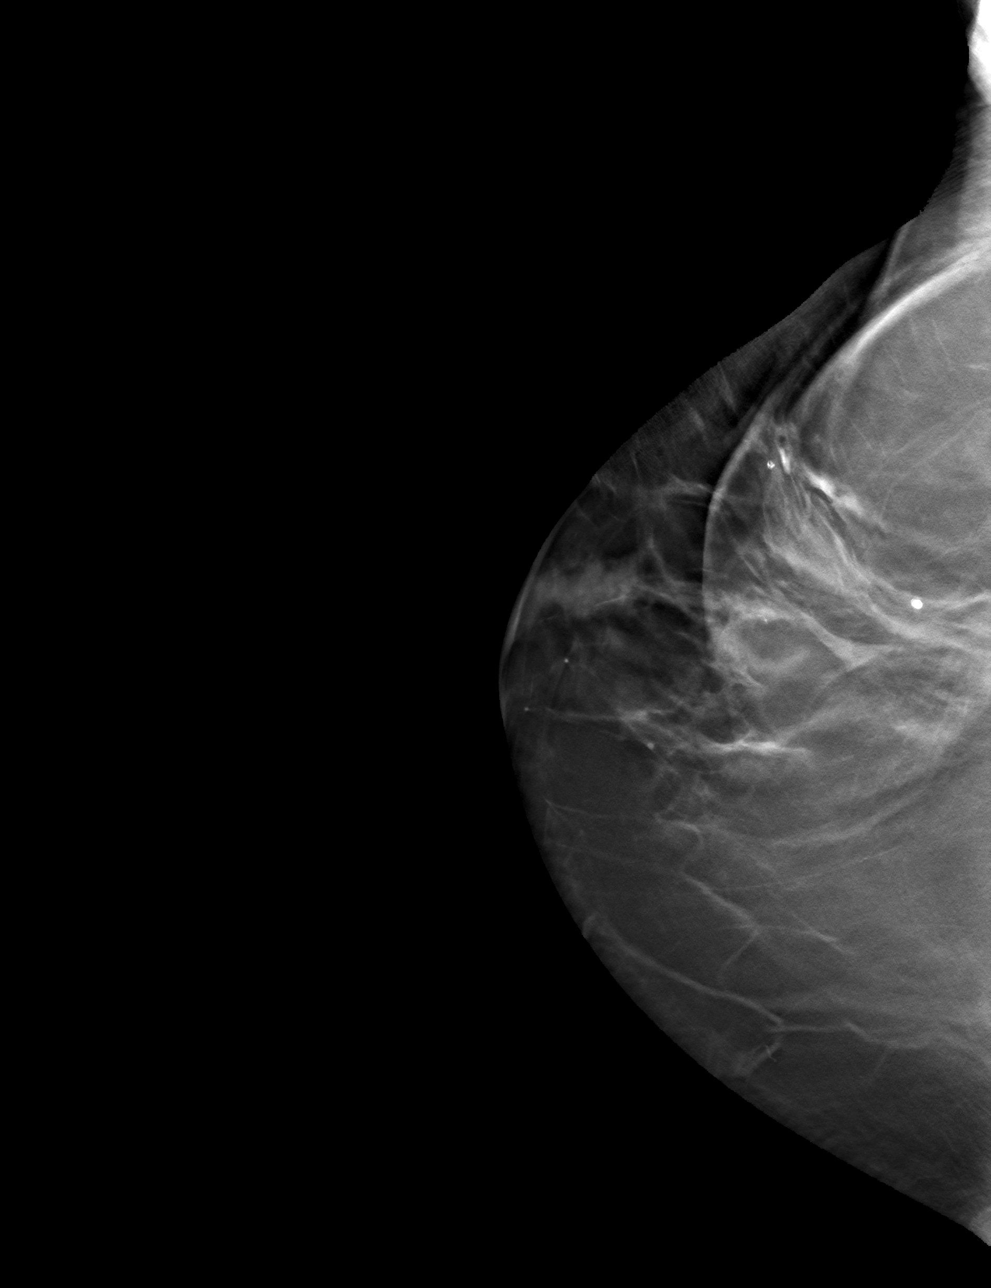

[4 of 12 positions shown; findings below may reference images not displayed]

FINDINGS: Mammographic images were obtained following ultrasound guided biopsy
of a mass in the right breast at 10 o'clock. The ribbon shaped
biopsy marking clip is well positioned at the intended site of
biopsy at 10 o'clock in the right breast.
IMPRESSION: Appropriate positioning of the ribbon shaped biopsy marking clip in
the right breast at 10 o'clock.

Final Assessment: Post Procedure Mammograms for Marker Placement

## 2021-06-19 IMAGING — US US  BREAST BX W/ LOC DEV 1ST LESION IMG BX SPEC US GUIDE*R*
1 series · 9 of 9 positions shown · non-contrast
Comparison: Previous exam(s).
COMPARISON: Previous exam(s).

Addendum:
CLINICAL DATA: 73-year-old female presenting for ultrasound-guided
biopsy of a right breast mass.

EXAM:
ULTRASOUND GUIDED RIGHT BREAST CORE NEEDLE BIOPSY

[Series 1: us breast bx w/ loc dev 1st lesion img bx spec us  · 0.06mm/px · 9 of 9 slices shown]
[im 1/9]
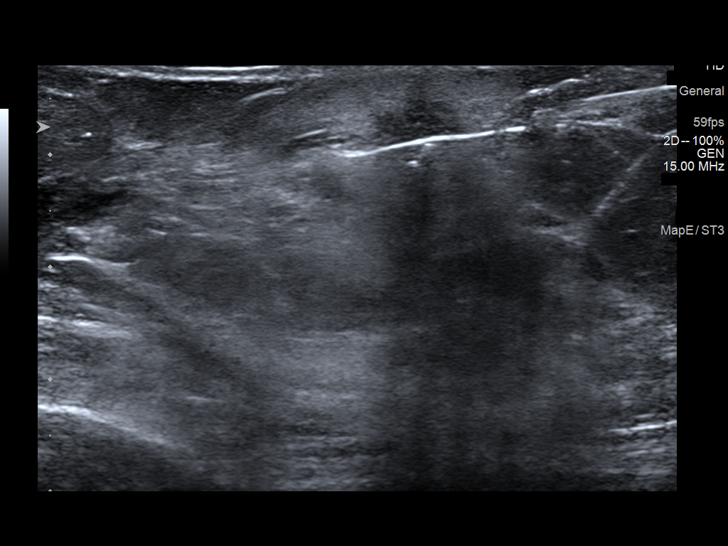
[im 2/9]
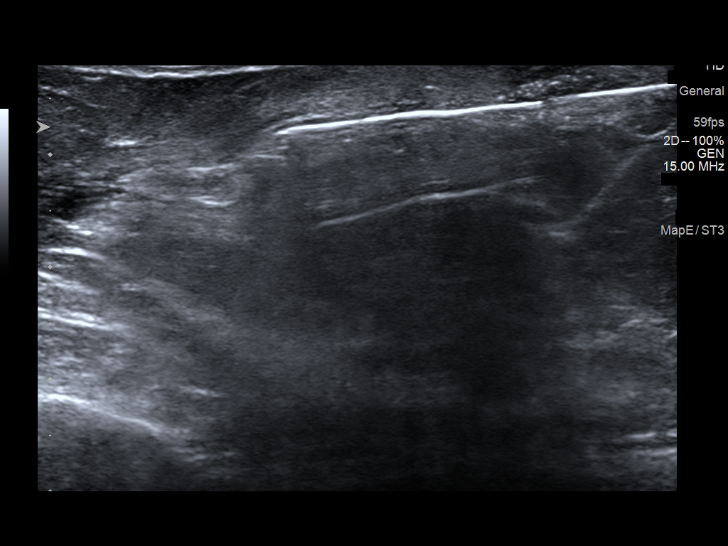
[im 3/9]
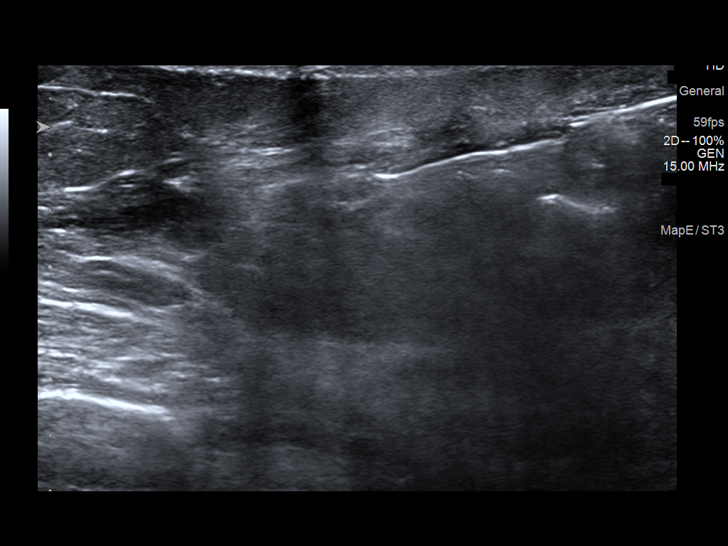
[im 4/9]
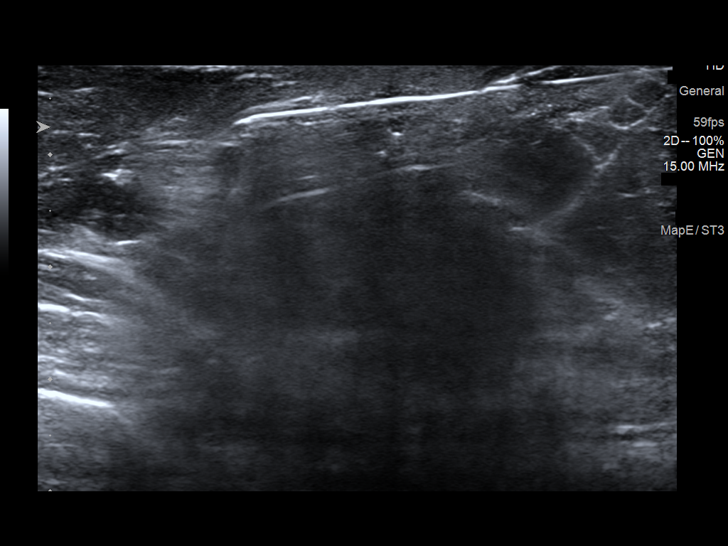
[im 5/9]
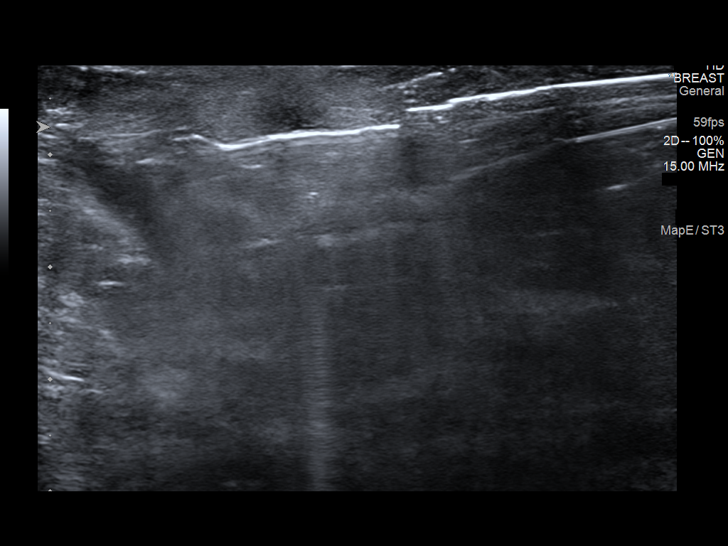
[im 6/9]
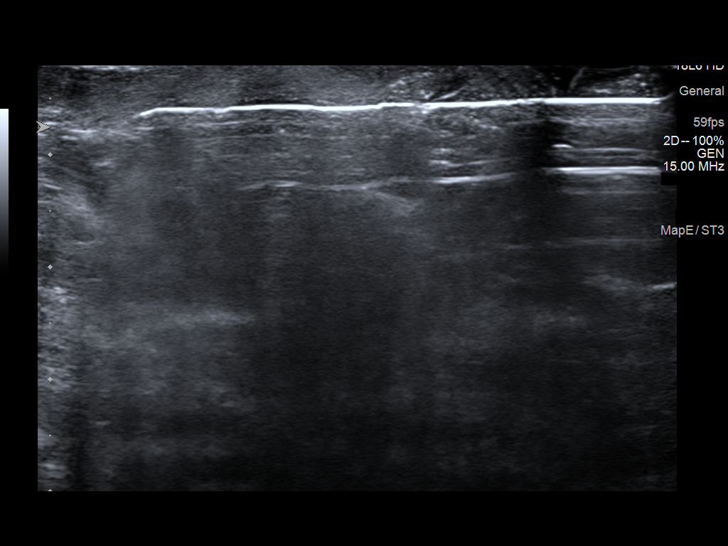
[im 7/9]
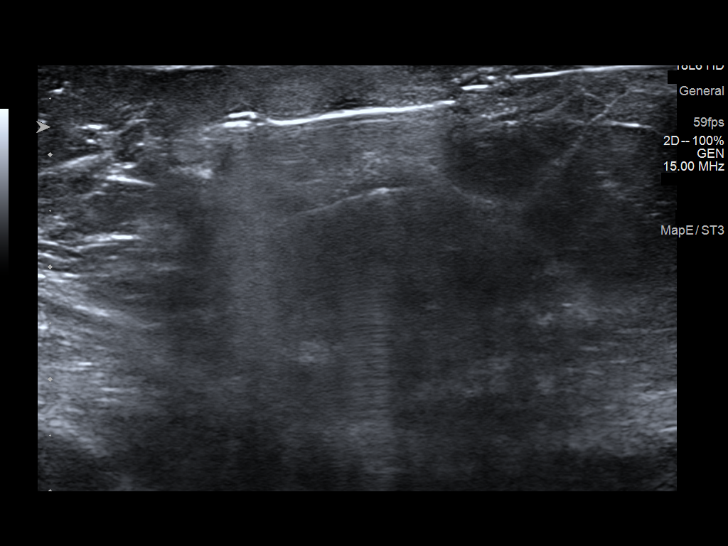
[im 8/9]
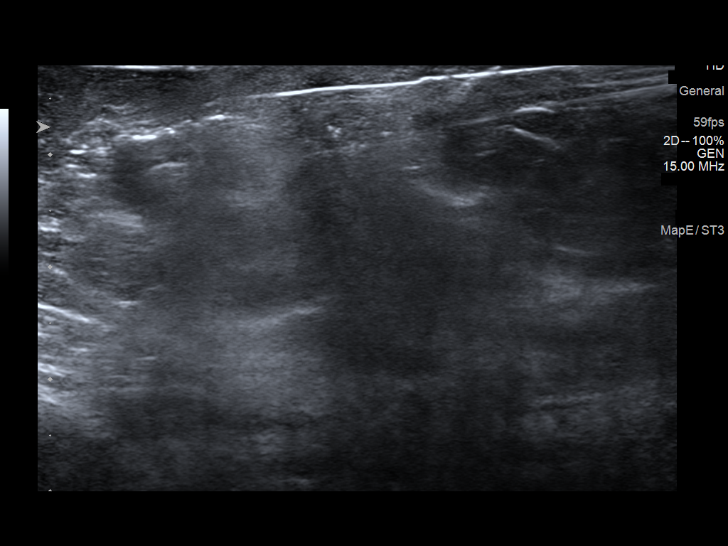
[im 9/9]
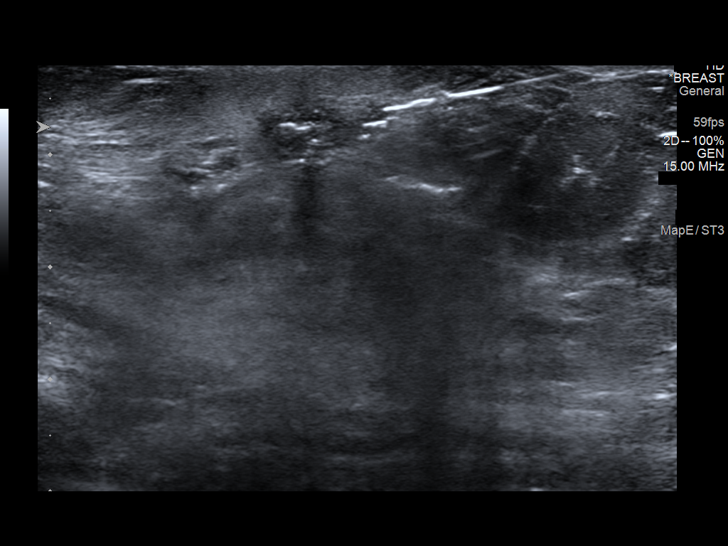

[9 of 9 positions shown; findings below may reference images not displayed]



Lesion quadrant: Upper-outer quadrant

Using sterile technique and 1% Lidocaine as local anesthetic, under
direct ultrasound visualization, a 14 gauge Hania device was
used to perform biopsy of a mass in the right breast at 10 o'clock
using an inferior approach. At the conclusion of the procedure a
ribbon shaped tissue marker clip was deployed into the biopsy
cavity. Follow up 2 view mammogram was performed and dictated
separately.
IMPRESSION: Ultrasound guided biopsy of a mass in the right breast at 10
o'clock. No apparent complications.

ADDENDUM:
Pathology revealed FAT NECROSIS WITH FIBROSIS of the RIGHT breast.
This was found to be concordant by Dr. Czarina Zabala, with
excision recommended.

Pathology results were discussed with the patient by telephone. The
patient reported doing well after the biopsy with tenderness at the
site. Post biopsy instructions and care were reviewed and questions
were answered. The patient was encouraged to call The [REDACTED]

Surgical consultation has been arranged with Dr. Randi Margrethe Varslot
at [[email or web address]] on July 22, 2019. The patient was
instructed to return for annual LEFT screening mammography August 2019 and informed a reminder notice would be sent regarding this
appointment.

Pathology results reported by Born Prz, RN on 07/09/2019.



Lesion quadrant: Upper-outer quadrant

Using sterile technique and 1% Lidocaine as local anesthetic, under
direct ultrasound visualization, a 14 gauge Hania device was
used to perform biopsy of a mass in the right breast at 10 o'clock
using an inferior approach. At the conclusion of the procedure a
ribbon shaped tissue marker clip was deployed into the biopsy
cavity. Follow up 2 view mammogram was performed and dictated
separately.
IMPRESSION: Ultrasound guided biopsy of a mass in the right breast at 10
o'clock. No apparent complications.

## 2021-07-13 DIAGNOSIS — E559 Vitamin D deficiency, unspecified: Secondary | ICD-10-CM | POA: Diagnosis not present

## 2021-07-13 DIAGNOSIS — M170 Bilateral primary osteoarthritis of knee: Secondary | ICD-10-CM | POA: Diagnosis not present

## 2021-07-13 DIAGNOSIS — Z853 Personal history of malignant neoplasm of breast: Secondary | ICD-10-CM | POA: Diagnosis not present

## 2021-07-13 DIAGNOSIS — B009 Herpesviral infection, unspecified: Secondary | ICD-10-CM | POA: Diagnosis not present

## 2021-07-13 DIAGNOSIS — E785 Hyperlipidemia, unspecified: Secondary | ICD-10-CM | POA: Diagnosis not present

## 2021-07-13 DIAGNOSIS — Z79899 Other long term (current) drug therapy: Secondary | ICD-10-CM | POA: Diagnosis not present

## 2021-07-13 DIAGNOSIS — G4700 Insomnia, unspecified: Secondary | ICD-10-CM | POA: Diagnosis not present

## 2021-07-13 DIAGNOSIS — Z23 Encounter for immunization: Secondary | ICD-10-CM | POA: Diagnosis not present

## 2021-07-13 DIAGNOSIS — K219 Gastro-esophageal reflux disease without esophagitis: Secondary | ICD-10-CM | POA: Diagnosis not present

## 2022-03-16 DIAGNOSIS — E559 Vitamin D deficiency, unspecified: Secondary | ICD-10-CM | POA: Diagnosis not present

## 2022-03-16 DIAGNOSIS — G43009 Migraine without aura, not intractable, without status migrainosus: Secondary | ICD-10-CM | POA: Diagnosis not present

## 2022-03-16 DIAGNOSIS — M170 Bilateral primary osteoarthritis of knee: Secondary | ICD-10-CM | POA: Diagnosis not present

## 2022-03-16 DIAGNOSIS — B009 Herpesviral infection, unspecified: Secondary | ICD-10-CM | POA: Diagnosis not present

## 2022-03-16 DIAGNOSIS — M8580 Other specified disorders of bone density and structure, unspecified site: Secondary | ICD-10-CM | POA: Diagnosis not present

## 2022-03-16 DIAGNOSIS — Z79899 Other long term (current) drug therapy: Secondary | ICD-10-CM | POA: Diagnosis not present

## 2022-03-16 DIAGNOSIS — G4700 Insomnia, unspecified: Secondary | ICD-10-CM | POA: Diagnosis not present

## 2022-03-16 DIAGNOSIS — Z1211 Encounter for screening for malignant neoplasm of colon: Secondary | ICD-10-CM | POA: Diagnosis not present

## 2022-03-16 DIAGNOSIS — Z853 Personal history of malignant neoplasm of breast: Secondary | ICD-10-CM | POA: Diagnosis not present

## 2022-03-16 DIAGNOSIS — Z0001 Encounter for general adult medical examination with abnormal findings: Secondary | ICD-10-CM | POA: Diagnosis not present

## 2022-03-16 DIAGNOSIS — E785 Hyperlipidemia, unspecified: Secondary | ICD-10-CM | POA: Diagnosis not present

## 2022-03-16 DIAGNOSIS — K219 Gastro-esophageal reflux disease without esophagitis: Secondary | ICD-10-CM | POA: Diagnosis not present

## 2022-04-09 ENCOUNTER — Other Ambulatory Visit: Payer: Self-pay | Admitting: Internal Medicine

## 2022-04-09 DIAGNOSIS — Z1231 Encounter for screening mammogram for malignant neoplasm of breast: Secondary | ICD-10-CM

## 2022-04-25 ENCOUNTER — Ambulatory Visit: Payer: Medicare Other

## 2022-05-08 ENCOUNTER — Ambulatory Visit
Admission: RE | Admit: 2022-05-08 | Discharge: 2022-05-08 | Disposition: A | Discharge status: Home / Self Care | Payer: Medicare Other | Source: Ambulatory Visit | Attending: Internal Medicine | Admitting: Internal Medicine

## 2022-05-08 DIAGNOSIS — Z1231 Encounter for screening mammogram for malignant neoplasm of breast: Secondary | ICD-10-CM | POA: Diagnosis not present

## 2022-07-06 DIAGNOSIS — H52203 Unspecified astigmatism, bilateral: Secondary | ICD-10-CM | POA: Diagnosis not present

## 2022-07-06 DIAGNOSIS — H2513 Age-related nuclear cataract, bilateral: Secondary | ICD-10-CM | POA: Diagnosis not present

## 2022-07-17 DIAGNOSIS — Z23 Encounter for immunization: Secondary | ICD-10-CM | POA: Diagnosis not present

## 2023-04-17 ENCOUNTER — Other Ambulatory Visit: Payer: Self-pay | Admitting: Internal Medicine

## 2023-04-17 DIAGNOSIS — Z1231 Encounter for screening mammogram for malignant neoplasm of breast: Secondary | ICD-10-CM

## 2023-05-14 ENCOUNTER — Ambulatory Visit
Admission: RE | Admit: 2023-05-14 | Discharge: 2023-05-14 | Disposition: A | Discharge status: Home / Self Care | Payer: Medicare Other | Source: Ambulatory Visit | Attending: Internal Medicine | Admitting: Internal Medicine

## 2023-05-14 DIAGNOSIS — Z1231 Encounter for screening mammogram for malignant neoplasm of breast: Secondary | ICD-10-CM

## 2024-05-04 ENCOUNTER — Other Ambulatory Visit (HOSPITAL_COMMUNITY): Payer: Self-pay | Admitting: Internal Medicine

## 2024-05-04 DIAGNOSIS — E785 Hyperlipidemia, unspecified: Secondary | ICD-10-CM

## 2024-05-11 ENCOUNTER — Other Ambulatory Visit: Payer: Self-pay | Admitting: Internal Medicine

## 2024-05-11 DIAGNOSIS — Z1231 Encounter for screening mammogram for malignant neoplasm of breast: Secondary | ICD-10-CM

## 2024-05-14 ENCOUNTER — Ambulatory Visit (HOSPITAL_COMMUNITY)
Admission: RE | Admit: 2024-05-14 | Discharge: 2024-05-14 | Disposition: A | Discharge status: Home / Self Care | Payer: Self-pay | Source: Ambulatory Visit | Attending: Internal Medicine | Admitting: Internal Medicine

## 2024-05-14 DIAGNOSIS — E785 Hyperlipidemia, unspecified: Secondary | ICD-10-CM | POA: Insufficient documentation

## 2024-06-03 ENCOUNTER — Ambulatory Visit
Admission: RE | Admit: 2024-06-03 | Discharge: 2024-06-03 | Disposition: A | Discharge status: Home / Self Care | Source: Ambulatory Visit | Attending: Internal Medicine | Admitting: Internal Medicine

## 2024-06-03 DIAGNOSIS — Z1231 Encounter for screening mammogram for malignant neoplasm of breast: Secondary | ICD-10-CM
